# Patient Record
Sex: Male | Born: 1960 | Race: White | Hispanic: No | Marital: Married | Smoking: Never smoker
Health system: Southern US, Community
[De-identification: ages and names within clinical notes are randomized; demographics above are authoritative.]

## PROBLEM LIST (undated history)

## (undated) DIAGNOSIS — I2510 Atherosclerotic heart disease of native coronary artery without angina pectoris: Secondary | ICD-10-CM

## (undated) DIAGNOSIS — F32A Depression, unspecified: Secondary | ICD-10-CM

## (undated) DIAGNOSIS — R7302 Impaired glucose tolerance (oral): Secondary | ICD-10-CM

## (undated) DIAGNOSIS — I200 Unstable angina: Secondary | ICD-10-CM

## (undated) DIAGNOSIS — F329 Major depressive disorder, single episode, unspecified: Secondary | ICD-10-CM

## (undated) DIAGNOSIS — R9439 Abnormal result of other cardiovascular function study: Secondary | ICD-10-CM

## (undated) DIAGNOSIS — I10 Essential (primary) hypertension: Secondary | ICD-10-CM

## (undated) DIAGNOSIS — R42 Dizziness and giddiness: Secondary | ICD-10-CM

## (undated) DIAGNOSIS — R0789 Other chest pain: Secondary | ICD-10-CM

## (undated) HISTORY — DX: Dizziness and giddiness: R42

## (undated) HISTORY — DX: Essential (primary) hypertension: I10

## (undated) HISTORY — DX: Morbid (severe) obesity due to excess calories: E66.01

## (undated) HISTORY — DX: Major depressive disorder, single episode, unspecified: F32.9

## (undated) HISTORY — DX: Unstable angina: I20.0

## (undated) HISTORY — DX: Other chest pain: R07.89

## (undated) HISTORY — DX: Impaired glucose tolerance (oral): R73.02

## (undated) HISTORY — DX: Depression, unspecified: F32.A

## (undated) HISTORY — PX: HYPOSPADIAS CORRECTION: SHX483

---

## 1999-02-07 ENCOUNTER — Other Ambulatory Visit: Admission: RE | Admit: 1999-02-07 | Discharge: 1999-02-07 | Payer: Self-pay | Admitting: Urology

## 2015-12-27 ENCOUNTER — Ambulatory Visit (HOSPITAL_COMMUNITY): Payer: 59 | Attending: Cardiovascular Disease

## 2015-12-27 ENCOUNTER — Encounter: Payer: Self-pay | Admitting: Cardiovascular Disease

## 2015-12-27 ENCOUNTER — Ambulatory Visit (INDEPENDENT_AMBULATORY_CARE_PROVIDER_SITE_OTHER): Payer: 59 | Admitting: Cardiovascular Disease

## 2015-12-27 VITALS — BP 150/90 | HR 58 | Ht 71.75 in | Wt 309.2 lb

## 2015-12-27 DIAGNOSIS — R9439 Abnormal result of other cardiovascular function study: Secondary | ICD-10-CM | POA: Insufficient documentation

## 2015-12-27 DIAGNOSIS — R42 Dizziness and giddiness: Secondary | ICD-10-CM | POA: Insufficient documentation

## 2015-12-27 DIAGNOSIS — I200 Unstable angina: Secondary | ICD-10-CM | POA: Insufficient documentation

## 2015-12-27 DIAGNOSIS — I208 Other forms of angina pectoris: Secondary | ICD-10-CM | POA: Diagnosis not present

## 2015-12-27 DIAGNOSIS — R0789 Other chest pain: Secondary | ICD-10-CM | POA: Insufficient documentation

## 2015-12-27 DIAGNOSIS — I10 Essential (primary) hypertension: Secondary | ICD-10-CM | POA: Diagnosis not present

## 2015-12-27 DIAGNOSIS — F329 Major depressive disorder, single episode, unspecified: Secondary | ICD-10-CM | POA: Insufficient documentation

## 2015-12-27 DIAGNOSIS — I517 Cardiomegaly: Secondary | ICD-10-CM | POA: Insufficient documentation

## 2015-12-27 DIAGNOSIS — R079 Chest pain, unspecified: Secondary | ICD-10-CM | POA: Diagnosis not present

## 2015-12-27 DIAGNOSIS — R7302 Impaired glucose tolerance (oral): Secondary | ICD-10-CM | POA: Insufficient documentation

## 2015-12-27 DIAGNOSIS — F32A Depression, unspecified: Secondary | ICD-10-CM | POA: Insufficient documentation

## 2015-12-27 LAB — MYOCARDIAL PERFUSION IMAGING
CHL CUP NUCLEAR SSS: 7
CSEPED: 9 min
CSEPPHR: 148 {beats}/min
Estimated workload: 10.1 METS
Exercise duration (sec): 0 s
LV dias vol: 163 mL
LVSYSVOL: 80 mL
MPHR: 166 {beats}/min
Percent HR: 89 %
RATE: 0.4
Rest HR: 55 {beats}/min
SDS: 5
SRS: 2
TID: 0.99

## 2015-12-27 MED ORDER — NITROGLYCERIN 0.4 MG SL SUBL: 0.4000 mg | SUBLINGUAL_TABLET | SUBLINGUAL | Status: DC | PRN

## 2015-12-27 MED ORDER — TECHNETIUM TC 99M SESTAMIBI GENERIC - CARDIOLITE
32.9000 | Freq: Once | INTRAVENOUS | Status: AC | PRN
  Administered 2015-12-27: 32.9 via INTRAVENOUS

## 2015-12-27 MED ORDER — TECHNETIUM TC 99M SESTAMIBI GENERIC - CARDIOLITE
10.2000 | Freq: Once | INTRAVENOUS | Status: AC | PRN
  Administered 2015-12-27: 10 via INTRAVENOUS

## 2015-12-27 NOTE — Patient Instructions (Signed)
Medication Instructions:  Your physician recommends that you continue on your current medications as directed. Please refer to the Current Medication list given to you today.  Prescription given for Nitroglycerin to use as needed for Chest Pain. Place one tablet under the tongue every 5 minutes for Chest Pain.  Maximum dosage 3 tablets within 15 minutes.  If you are taking a third tablet then you need to call 911.   Labwork: No new orders.   Testing/Procedures: Your physician has requested that you have an exercise stress myoview. For further information please visit [email or web address]. Please follow instruction sheet, as given.  Follow-Up: Your physician recommends that you schedule a follow-up appointment in: [place] with Dr Burt Knack   Any Other Special Instructions Will Be Listed Below (If Applicable).     If you need a refill on your cardiac medications before your next appointment, please call your pharmacy.

## 2015-12-27 NOTE — Progress Notes (Signed)
Cardiology Office Note Date:  12/27/2015   ID:  PRIDE NOVO, DOB 08/12/1961, MRN [id number]  PCP:  Geoffery Lyons, MD  Cardiologist:  Sherren Mocha, MD    Chief Complaint  Patient presents with  . New Evaluation    +"Pinch" in chest   denies sob/le edema     History of Present Illness: Francis Foster is a 55 y.o. male who presents for evaluation of chest pain. He's been followed regularly by Dr Reynaldo Minium for > 20 years and other than obesity and controlled hypertension he hasn't had any major medical problems. He's never been hospitalized.   He had 2 episodes of chest tightness over the Holidays. He checked his blood pressure during those episodes and it was markedly elevated, > 248mmHg on one occasion. Last week he was at the beach and went for a walk, experienced chest pressure about 15 minutes into his walk. Describes a 'pressure' that was in the substernal region, nonradiating, and sx's subsided when he slowed his pace. This felt similar to the chest pain he had on other occasions recently. Denies shortness of breath, edema, orthopnea, or PND.  Describes an episode of lightheadedness and weakness, diaphoresis recently when he was stooped over washing the car. No other recent symptoms to report.   Reports weight gain over the last 6 months, approximately 15 pounds.   There is no family history of CAD. Both parents are alive at 9 and 40 without cardiovascular problems.   Past Medical History  Diagnosis Date  . Benign essential HTN   . Morbid obesity [facility])     Past Surgical History  Procedure Laterality Date  . Hypospadias correction      55 years old    Current Outpatient Prescriptions  Medication Sig Dispense Refill  . aspirin 81 MG tablet Take 81 mg by mouth daily.    Marland Kitchen lisinopril-hydrochlorothiazide (PRINZIDE,ZESTORETIC) 20-12.5 MG tablet Take 1 tablet by mouth daily.    . nebivolol (BYSTOLIC) 10 MG tablet Take 10 mg by mouth daily.    . nitroGLYCERIN  (NITROSTAT) 0.4 MG SL tablet Place 1 tablet (0.4 mg total) under the tongue every 5 (five) minutes as needed for chest pain. 25 tablet 2   No current facility-administered medications for this visit.    Allergies:   Review of patient's allergies indicates not on file.   Social History:  The patient  reports that he has never smoked. He does not have any smokeless tobacco history on file. He reports that he drinks alcohol. He reports that he does not use illicit drugs.   Family History:  The patient's family history is negative for CAD/MI/CABG.  ROS:  Please see the history of present illness.  Otherwise, review of systems is positive for dizziness.  All other systems are reviewed and negative.    PHYSICAL EXAM: VS:  BP 150/90 mmHg  Pulse 58  Ht 5' 11.75" (1.822 m)  Wt 309 lb 3.2 oz (140.252 kg)  BMI 42.25 kg/m2 , BMI Body mass index is 42.25 kg/(m^2). GEN: Well nourished, well developed, obese male in no acute distress HEENT: normal Neck: no JVD, no masses. No carotid bruits Cardiac: RRR without murmur or gallop                Respiratory:  clear to auscultation bilaterally, normal work of breathing GI: soft, nontender, nondistended, + BS MS: no deformity or atrophy Ext: no pretibial edema, pedal pulses 2+= bilaterally Skin: warm and dry, no rash Neuro:  Strength and sensation are intact Psych: euthymic mood, full affect  EKG:  EKG is ordered today. The ekg ordered today shows Sinus bradycardia 58 bpm, otherwise within normal limits  Recent Labs: No results found for requested labs within last 365 days.   Lipid Panel  No results found for: CHOL, TRIG, HDL, CHOLHDL, VLDL, LDLCALC, LDLDIRECT    Wt Readings from Last 3 Encounters:  12/27/15 309 lb 3.2 oz (140.252 kg)     ASSESSMENT AND PLAN: 1.  Exertional angina: new onset symptoms with moderate physical exertion. Also with 2 episdoes of resting chest discomfort over the Holidays associated with marked hypertension.  Differential dx includes obstructive CAD versus hypertensive disease as most likely etiologies. He has been started on a beta-blocker by Dr Reynaldo Minium, now taking ASA 81 mg daily as well. I recommended that he continue same Rx's with bystolic, zestoretic, and ASA. Will check an exercise stress Myoview for further risk stratification. We had a lengthy discussion about diagnostic approach of noninvasive stress testing versus cardiac cath as first test and he prefers noninvasive assessment. Further disposition/testing pending stress test results. He is written a Rx for sublingual NTG prn and provided instructions if recurrent chest pain occurs.  Will arrange FU in 6 weeks and he will call sooner if recurrent symptoms.  2. Essential HTN, uncontrolled: zestoretic long-term, bystolic added yesterday. Continue same Rx.  Current medicines are reviewed with the patient today.  The patient does not have concerns regarding medicines.  Labs/ tests ordered today include:   Orders Placed This Encounter  Procedures  . Myocardial Perfusion Imaging  . EKG 12-Lead    Disposition:   FU 6 weeks or sooner if problems  Signed, Sherren Mocha, MD  12/27/2015 9:09 AM    [facility] [place], [place], [place]  [address] Phone: [phone]; Fax: [phone]

## 2015-12-28 ENCOUNTER — Encounter (HOSPITAL_COMMUNITY)
Admission: RE | Disposition: A | Discharge status: Home / Self Care | Payer: Self-pay | Source: Ambulatory Visit | Attending: Cardiovascular Disease

## 2015-12-28 ENCOUNTER — Other Ambulatory Visit: Payer: Self-pay | Admitting: Cardiovascular Disease

## 2015-12-28 ENCOUNTER — Other Ambulatory Visit: Payer: Self-pay

## 2015-12-28 ENCOUNTER — Encounter (HOSPITAL_COMMUNITY): Payer: Self-pay | Admitting: Cardiovascular Disease

## 2015-12-28 ENCOUNTER — Ambulatory Visit (HOSPITAL_COMMUNITY)
Admission: RE | Admit: 2015-12-28 | Discharge: 2015-12-29 | Disposition: A | Discharge status: Home / Self Care | Payer: 59 | Source: Ambulatory Visit | Attending: Cardiovascular Disease | Admitting: Cardiovascular Disease

## 2015-12-28 DIAGNOSIS — Z6841 Body Mass Index (BMI) 40.0 and over, adult: Secondary | ICD-10-CM | POA: Insufficient documentation

## 2015-12-28 DIAGNOSIS — I208 Other forms of angina pectoris: Secondary | ICD-10-CM

## 2015-12-28 DIAGNOSIS — Z955 Presence of coronary angioplasty implant and graft: Secondary | ICD-10-CM

## 2015-12-28 DIAGNOSIS — I10 Essential (primary) hypertension: Secondary | ICD-10-CM | POA: Insufficient documentation

## 2015-12-28 DIAGNOSIS — I25118 Atherosclerotic heart disease of native coronary artery with other forms of angina pectoris: Secondary | ICD-10-CM | POA: Diagnosis present

## 2015-12-28 DIAGNOSIS — Z7982 Long term (current) use of aspirin: Secondary | ICD-10-CM | POA: Insufficient documentation

## 2015-12-28 DIAGNOSIS — I2089 Other forms of angina pectoris: Secondary | ICD-10-CM | POA: Diagnosis present

## 2015-12-28 DIAGNOSIS — R9439 Abnormal result of other cardiovascular function study: Secondary | ICD-10-CM | POA: Diagnosis present

## 2015-12-28 DIAGNOSIS — I2510 Atherosclerotic heart disease of native coronary artery without angina pectoris: Secondary | ICD-10-CM | POA: Diagnosis not present

## 2015-12-28 HISTORY — PX: CARDIAC CATHETERIZATION: SHX172

## 2015-12-28 HISTORY — DX: Abnormal result of other cardiovascular function study: R94.39

## 2015-12-28 HISTORY — DX: Atherosclerotic heart disease of native coronary artery without angina pectoris: I25.10

## 2015-12-28 LAB — BASIC METABOLIC PANEL
Anion gap: 9 (ref 5–15)
BUN: 22 mg/dL — AB (ref 6–20)
CHLORIDE: 105 mmol/L (ref 101–111)
CO2: 27 mmol/L (ref 22–32)
CREATININE: 1.14 mg/dL (ref 0.61–1.24)
Calcium: 9.8 mg/dL (ref 8.9–10.3)
GFR calc Af Amer: >60 mL/min (ref >60)
GFR calc non Af Amer: >60 mL/min (ref >60)
GLUCOSE: 103 mg/dL — AB (ref 65–99)
Potassium: 5 mmol/L (ref 3.5–5.1)
SODIUM: 141 mmol/L (ref 135–145)

## 2015-12-28 LAB — PROTIME-INR
INR: 1.07 (ref 0.00–1.49)
Prothrombin Time: 14.1 seconds (ref 11.6–15.2)

## 2015-12-28 LAB — CBC
HEMATOCRIT: 46 % (ref 39.0–52.0)
Hemoglobin: 15.1 g/dL (ref 13.0–17.0)
MCH: 30 pg (ref 26.0–34.0)
MCHC: 32.8 g/dL (ref 30.0–36.0)
MCV: 91.3 fL (ref 78.0–100.0)
PLATELETS: 178 10*3/uL (ref 150–400)
RBC: 5.04 MIL/uL (ref 4.22–5.81)
RDW: 13.6 % (ref 11.5–15.5)
WBC: 7.1 10*3/uL (ref 4.0–10.5)

## 2015-12-28 LAB — POCT ACTIVATED CLOTTING TIME: Activated Clotting Time: 291 seconds

## 2015-12-28 SURGERY — LEFT HEART CATH AND CORONARY ANGIOGRAPHY

## 2015-12-28 MED ORDER — SODIUM CHLORIDE 0.9 % IJ SOLN: 3.0000 mL | Freq: Two times a day (BID) | INTRAMUSCULAR | Status: DC

## 2015-12-28 MED ORDER — IOHEXOL 350 MG/ML SOLN
INTRAVENOUS | Status: DC | PRN
  Administered 2015-12-28: 135 mL via INTRA_ARTERIAL

## 2015-12-28 MED ORDER — NITROGLYCERIN 1 MG/10 ML FOR IR/CATH LAB
INTRA_ARTERIAL | Status: DC | PRN
  Administered 2015-12-28: 150 ug via INTRACORONARY

## 2015-12-28 MED ORDER — ONDANSETRON HCL 4 MG/2ML IJ SOLN: 4.0000 mg | Freq: Four times a day (QID) | INTRAMUSCULAR | Status: DC | PRN

## 2015-12-28 MED ORDER — ATORVASTATIN CALCIUM 80 MG PO TABS
80.0000 mg | ORAL_TABLET | Freq: Every day | ORAL | Status: DC
  Administered 2015-12-28: 21:00:00 80 mg via ORAL
  Filled 2015-12-28: qty 1

## 2015-12-28 MED ORDER — HYDROCHLOROTHIAZIDE 12.5 MG PO CAPS
12.5000 mg | ORAL_CAPSULE | Freq: Every day | ORAL | Status: DC
  Administered 2015-12-29: 09:00:00 12.5 mg via ORAL
  Filled 2015-12-28: qty 1

## 2015-12-28 MED ORDER — HEPARIN SODIUM (PORCINE) 1000 UNIT/ML IJ SOLN
INTRAMUSCULAR | Status: AC
  Filled 2015-12-28: qty 1

## 2015-12-28 MED ORDER — FENTANYL CITRATE (PF) 100 MCG/2ML IJ SOLN
INTRAMUSCULAR | Status: AC
  Filled 2015-12-28: qty 2

## 2015-12-28 MED ORDER — FENTANYL CITRATE (PF) 100 MCG/2ML IJ SOLN
INTRAMUSCULAR | Status: DC | PRN
  Administered 2015-12-28 (×2): 25 ug via INTRAVENOUS

## 2015-12-28 MED ORDER — NEBIVOLOL HCL 10 MG PO TABS
10.0000 mg | ORAL_TABLET | Freq: Every day | ORAL | Status: DC
  Administered 2015-12-29: 09:00:00 10 mg via ORAL
  Filled 2015-12-28: qty 1

## 2015-12-28 MED ORDER — HEPARIN (PORCINE) IN NACL 2-0.9 UNIT/ML-% IJ SOLN
INTRAMUSCULAR | Status: DC | PRN
  Administered 2015-12-28: 16:00:00

## 2015-12-28 MED ORDER — ASPIRIN 81 MG PO CHEW: 81.0000 mg | CHEWABLE_TABLET | ORAL | Status: DC

## 2015-12-28 MED ORDER — MIDAZOLAM HCL 2 MG/2ML IJ SOLN
INTRAMUSCULAR | Status: DC | PRN
Start: 2015-12-28 — End: 2015-12-28
  Administered 2015-12-28 (×2): 2 mg via INTRAVENOUS

## 2015-12-28 MED ORDER — SODIUM CHLORIDE 0.9 % IV SOLN
INTRAVENOUS | Status: DC
  Administered 2015-12-28: 13:00:00 via INTRAVENOUS

## 2015-12-28 MED ORDER — CLOPIDOGREL BISULFATE 75 MG PO TABS
75.0000 mg | ORAL_TABLET | Freq: Every day | ORAL | Status: DC
  Administered 2015-12-29: 07:00:00 75 mg via ORAL
  Filled 2015-12-28: qty 1

## 2015-12-28 MED ORDER — ASPIRIN 81 MG PO CHEW
81.0000 mg | CHEWABLE_TABLET | Freq: Every day | ORAL | Status: DC
Start: 2015-12-29 — End: 2015-12-29
  Administered 2015-12-29: 09:00:00 81 mg via ORAL
  Filled 2015-12-28: qty 1

## 2015-12-28 MED ORDER — CLOPIDOGREL BISULFATE 300 MG PO TABS
ORAL_TABLET | ORAL | Status: DC | PRN
  Administered 2015-12-28: 600 mg via ORAL

## 2015-12-28 MED ORDER — ANGIOPLASTY BOOK
Freq: Once | Status: AC
  Administered 2015-12-28
  Filled 2015-12-28: qty 1

## 2015-12-28 MED ORDER — LISINOPRIL 10 MG PO TABS
20.0000 mg | ORAL_TABLET | Freq: Every day | ORAL | Status: DC
  Administered 2015-12-29: 20 mg via ORAL
  Filled 2015-12-28: qty 2

## 2015-12-28 MED ORDER — SODIUM CHLORIDE 0.9 % IV SOLN: INTRAVENOUS | Status: DC

## 2015-12-28 MED ORDER — MIDAZOLAM HCL 2 MG/2ML IJ SOLN
INTRAMUSCULAR | Status: AC
  Filled 2015-12-28: qty 2

## 2015-12-28 MED ORDER — OXYCODONE-ACETAMINOPHEN 5-325 MG PO TABS: 1.0000 | ORAL_TABLET | ORAL | Status: DC | PRN

## 2015-12-28 MED ORDER — SODIUM CHLORIDE 0.9 % IV SOLN
250.0000 mL | INTRAVENOUS | Status: DC | PRN
Start: 2015-12-28 — End: 2015-12-28

## 2015-12-28 MED ORDER — CLOPIDOGREL BISULFATE 300 MG PO TABS
ORAL_TABLET | ORAL | Status: AC
  Filled 2015-12-28: qty 2

## 2015-12-28 MED ORDER — NITROGLYCERIN 1 MG/10 ML FOR IR/CATH LAB
INTRA_ARTERIAL | Status: AC
  Filled 2015-12-28: qty 10

## 2015-12-28 MED ORDER — HEPARIN SODIUM (PORCINE) 1000 UNIT/ML IJ SOLN
INTRAMUSCULAR | Status: DC | PRN
  Administered 2015-12-28 (×2): 6000 [IU] via INTRAVENOUS

## 2015-12-28 MED ORDER — VERAPAMIL HCL 2.5 MG/ML IV SOLN
INTRAVENOUS | Status: AC
  Filled 2015-12-28: qty 2

## 2015-12-28 MED ORDER — SODIUM CHLORIDE 0.9 % IJ SOLN: 3.0000 mL | INTRAMUSCULAR | Status: DC | PRN

## 2015-12-28 MED ORDER — HEPARIN (PORCINE) IN NACL 2-0.9 UNIT/ML-% IJ SOLN
INTRAMUSCULAR | Status: AC
  Filled 2015-12-28: qty 1000

## 2015-12-28 MED ORDER — VERAPAMIL HCL 2.5 MG/ML IV SOLN
INTRAVENOUS | Status: DC | PRN
  Administered 2015-12-28: 16:00:00 via INTRA_ARTERIAL

## 2015-12-28 MED ORDER — SODIUM CHLORIDE 0.9 % IV SOLN: 250.0000 mL | INTRAVENOUS | Status: DC | PRN

## 2015-12-28 MED ORDER — ACETAMINOPHEN 325 MG PO TABS: 650.0000 mg | ORAL_TABLET | ORAL | Status: DC | PRN

## 2015-12-28 MED ORDER — LISINOPRIL-HYDROCHLOROTHIAZIDE 20-12.5 MG PO TABS: 1.0000 | ORAL_TABLET | Freq: Every day | ORAL | Status: DC

## 2015-12-28 MED ORDER — LIDOCAINE HCL (PF) 1 % IJ SOLN
INTRAMUSCULAR | Status: AC
  Filled 2015-12-28: qty 30

## 2015-12-28 SURGICAL SUPPLY — 19 items
BALLN EMERGE MR 2.0X12 (BALLOONS) ×2
BALLN ~~LOC~~ EMERGE MR 2.5X8 (BALLOONS) ×2
BALLOON EMERGE MR 2.0X12 (BALLOONS) IMPLANT
BALLOON ~~LOC~~ EMERGE MR 2.5X8 (BALLOONS) IMPLANT
CATH INFINITI 5 FR JL3.5 (CATHETERS) ×2 IMPLANT
CATH INFINITI 5FR ANG PIGTAIL (CATHETERS) IMPLANT
CATH INFINITI JR4 5F (CATHETERS) ×2 IMPLANT
CATH VISTA GUIDE 6FR XBLAD3.5 (CATHETERS) ×1 IMPLANT
DEVICE RAD COMP TR BAND LRG (VASCULAR PRODUCTS) ×2 IMPLANT
GLIDESHEATH SLEND SS 6F .021 (SHEATH) ×2 IMPLANT
KIT ENCORE 26 ADVANTAGE (KITS) ×1 IMPLANT
KIT HEART LEFT (KITS) ×2 IMPLANT
PACK CARDIAC CATHETERIZATION (CUSTOM PROCEDURE TRAY) ×2 IMPLANT
STENT PROMUS PREM MR 2.5X12 (Permanent Stent) ×1 IMPLANT
SYR MEDRAD MARK V 150ML (SYRINGE) IMPLANT
TRANSDUCER W/STOPCOCK (MISCELLANEOUS) ×2 IMPLANT
TUBING CIL FLEX 10 FLL-RA (TUBING) ×2 IMPLANT
WIRE COUGAR XT STRL 190CM (WIRE) ×1 IMPLANT
WIRE SAFE-T 1.5MM-J .035X260CM (WIRE) ×2 IMPLANT

## 2015-12-28 NOTE — H&P (View-Only) (Signed)
Cardiology Office Note Date:  12/27/2015   ID:  Francis Foster, DOB October 27, 1961, MRN [id number]  PCP:  Geoffery Lyons, MD  Cardiologist:  Sherren Mocha, MD    Chief Complaint  Patient presents with  . New Evaluation    +"Pinch" in chest   denies sob/le edema     History of Present Illness: Francis Foster is a 55 y.o. male who presents for evaluation of chest pain. He's been followed regularly by Dr Reynaldo Minium for > 20 years and other than obesity and controlled hypertension he hasn't had any major medical problems. He's never been hospitalized.   He had 2 episodes of chest tightness over the Holidays. He checked his blood pressure during those episodes and it was markedly elevated, > 28mmHg on one occasion. Last week he was at the beach and went for a walk, experienced chest pressure about 15 minutes into his walk. Describes a 'pressure' that was in the substernal region, nonradiating, and sx's subsided when he slowed his pace. This felt similar to the chest pain he had on other occasions recently. Denies shortness of breath, edema, orthopnea, or PND.  Describes an episode of lightheadedness and weakness, diaphoresis recently when he was stooped over washing the car. No other recent symptoms to report.   Reports weight gain over the last 6 months, approximately 15 pounds.   There is no family history of CAD. Both parents are alive at 53 and 37 without cardiovascular problems.   Past Medical History  Diagnosis Date  . Benign essential HTN   . Morbid obesity [facility]     Past Surgical History  Procedure Laterality Date  . Hypospadias correction      55 years old    Current Outpatient Prescriptions  Medication Sig Dispense Refill  . aspirin 81 MG tablet Take 81 mg by mouth daily.    Marland Kitchen lisinopril-hydrochlorothiazide (PRINZIDE,ZESTORETIC) 20-12.5 MG tablet Take 1 tablet by mouth daily.    . nebivolol (BYSTOLIC) 10 MG tablet Take 10 mg by mouth daily.    . nitroGLYCERIN  (NITROSTAT) 0.4 MG SL tablet Place 1 tablet (0.4 mg total) under the tongue every 5 (five) minutes as needed for chest pain. 25 tablet 2   No current facility-administered medications for this visit.    Allergies:   Review of patient's allergies indicates not on file.   Social History:  The patient  reports that he has never smoked. He does not have any smokeless tobacco history on file. He reports that he drinks alcohol. He reports that he does not use illicit drugs.   Family History:  The patient's family history is negative for CAD/MI/CABG.  ROS:  Please see the history of present illness.  Otherwise, review of systems is positive for dizziness.  All other systems are reviewed and negative.    PHYSICAL EXAM: VS:  BP 150/90 mmHg  Pulse 58  Ht 5' 11.75" (1.822 m)  Wt 309 lb 3.2 oz (140.252 kg)  BMI 42.25 kg/m2 , BMI Body mass index is 42.25 kg/(m^2). GEN: Well nourished, well developed, obese male in no acute distress HEENT: normal Neck: no JVD, no masses. No carotid bruits Cardiac: RRR without murmur or gallop                Respiratory:  clear to auscultation bilaterally, normal work of breathing GI: soft, nontender, nondistended, + BS MS: no deformity or atrophy Ext: no pretibial edema, pedal pulses 2+= bilaterally Skin: warm and dry, no rash Neuro:  Strength and sensation are intact Psych: euthymic mood, full affect  EKG:  EKG is ordered today. The ekg ordered today shows Sinus bradycardia 58 bpm, otherwise within normal limits  Recent Labs: No results found for requested labs within last 365 days.   Lipid Panel  No results found for: CHOL, TRIG, HDL, CHOLHDL, VLDL, LDLCALC, LDLDIRECT    Wt Readings from Last 3 Encounters:  12/27/15 309 lb 3.2 oz (140.252 kg)     ASSESSMENT AND PLAN: 1.  Exertional angina: new onset symptoms with moderate physical exertion. Also with 2 episdoes of resting chest discomfort over the Holidays associated with marked hypertension.  Differential dx includes obstructive CAD versus hypertensive disease as most likely etiologies. He has been started on a beta-blocker by Dr Reynaldo Minium, now taking ASA 81 mg daily as well. I recommended that he continue same Rx's with bystolic, zestoretic, and ASA. Will check an exercise stress Myoview for further risk stratification. We had a lengthy discussion about diagnostic approach of noninvasive stress testing versus cardiac cath as first test and he prefers noninvasive assessment. Further disposition/testing pending stress test results. He is written a Rx for sublingual NTG prn and provided instructions if recurrent chest pain occurs.  Will arrange FU in 6 weeks and he will call sooner if recurrent symptoms.  2. Essential HTN, uncontrolled: zestoretic long-term, bystolic added yesterday. Continue same Rx.  Current medicines are reviewed with the patient today.  The patient does not have concerns regarding medicines.  Labs/ tests ordered today include:   Orders Placed This Encounter  Procedures  . Myocardial Perfusion Imaging  . EKG 12-Lead    Disposition:   FU 6 weeks or sooner if problems  Signed, Sherren Mocha, MD  12/27/2015 9:09 AM    [facility] [place], [place], [place]  [address] Phone: [phone]; Fax: [phone]

## 2015-12-28 NOTE — Research (Signed)
CADLAD Informed Consent   Subject Name: Francis Foster  Subject met inclusion and exclusion criteria.  The informed consent form, study requirements and expectations were reviewed with the subject and questions and concerns were addressed prior to the signing of the consent form.  The subject verbalized understanding of the trail requirements.  The subject agreed to participate in the CADLAD trial and signed the informed consent.  The informed consent was obtained prior to performance of any protocol-specific procedures for the subject.  A copy of the signed informed consent was given to the subject and a copy was placed in the subject's medical record.  Jake Bathe Jr. 12/28/2015, 1250pm

## 2015-12-28 NOTE — Interval H&P Note (Signed)
Cath Lab Visit (complete for each Cath Lab visit)  Clinical Evaluation Leading to the Procedure:   ACS: No.  Non-ACS:    Anginal Classification: CCS II  Anti-ischemic medical therapy: Minimal Therapy (1 class of medications)  Non-Invasive Test Results: Intermediate-risk stress test findings: cardiac mortality 1-3%/year  Prior CABG: No previous CABG      History and Physical Interval Note:  12/28/2015 3:19 PM  Francis Foster  has presented today for surgery, with the diagnosis of cp/abnormal mioview  The various methods of treatment have been discussed with the patient and family. After consideration of risks, benefits and other options for treatment, the patient has consented to  Procedure(s): Left Heart Cath and Coronary Angiography (N/A) as a surgical intervention .  The patient's history has been reviewed, patient examined, no change in status, stable for surgery.  I have reviewed the patient's chart and labs.  Questions were answered to the patient's satisfaction.     Sherren Mocha

## 2015-12-29 ENCOUNTER — Encounter (HOSPITAL_COMMUNITY): Payer: Self-pay | Admitting: Cardiology

## 2015-12-29 ENCOUNTER — Other Ambulatory Visit: Payer: Self-pay

## 2015-12-29 DIAGNOSIS — I208 Other forms of angina pectoris: Secondary | ICD-10-CM

## 2015-12-29 DIAGNOSIS — I25118 Atherosclerotic heart disease of native coronary artery with other forms of angina pectoris: Secondary | ICD-10-CM | POA: Diagnosis not present

## 2015-12-29 DIAGNOSIS — I10 Essential (primary) hypertension: Secondary | ICD-10-CM | POA: Diagnosis not present

## 2015-12-29 DIAGNOSIS — I25110 Atherosclerotic heart disease of native coronary artery with unstable angina pectoris: Secondary | ICD-10-CM | POA: Diagnosis not present

## 2015-12-29 DIAGNOSIS — Z6841 Body Mass Index (BMI) 40.0 and over, adult: Secondary | ICD-10-CM | POA: Diagnosis not present

## 2015-12-29 DIAGNOSIS — R931 Abnormal findings on diagnostic imaging of heart and coronary circulation: Secondary | ICD-10-CM | POA: Diagnosis not present

## 2015-12-29 DIAGNOSIS — I2510 Atherosclerotic heart disease of native coronary artery without angina pectoris: Secondary | ICD-10-CM | POA: Diagnosis not present

## 2015-12-29 HISTORY — DX: Atherosclerotic heart disease of native coronary artery without angina pectoris: I25.10

## 2015-12-29 LAB — BASIC METABOLIC PANEL
Anion gap: 7 (ref 5–15)
BUN: 23 mg/dL — AB (ref 6–20)
CALCIUM: 9 mg/dL (ref 8.9–10.3)
CO2: 26 mmol/L (ref 22–32)
Chloride: 106 mmol/L (ref 101–111)
Creatinine, Ser: 1.09 mg/dL (ref 0.61–1.24)
GFR calc Af Amer: >60 mL/min (ref >60)
GLUCOSE: 99 mg/dL (ref 65–99)
Potassium: 4.1 mmol/L (ref 3.5–5.1)
Sodium: 139 mmol/L (ref 135–145)

## 2015-12-29 LAB — CBC
HCT: 41.3 % (ref 39.0–52.0)
Hemoglobin: 13.8 g/dL (ref 13.0–17.0)
MCH: 30.5 pg (ref 26.0–34.0)
MCHC: 33.4 g/dL (ref 30.0–36.0)
MCV: 91.4 fL (ref 78.0–100.0)
Platelets: 167 10*3/uL (ref 150–400)
RBC: 4.52 MIL/uL (ref 4.22–5.81)
RDW: 13.8 % (ref 11.5–15.5)
WBC: 8.3 10*3/uL (ref 4.0–10.5)

## 2015-12-29 MED ORDER — ATORVASTATIN CALCIUM 80 MG PO TABS: 80.0000 mg | ORAL_TABLET | Freq: Every day | ORAL | Status: DC

## 2015-12-29 MED ORDER — CLOPIDOGREL BISULFATE 75 MG PO TABS: 75.0000 mg | ORAL_TABLET | Freq: Every day | ORAL | Status: DC

## 2015-12-29 MED ORDER — LISINOPRIL-HYDROCHLOROTHIAZIDE 20-12.5 MG PO TABS: 1.0000 | ORAL_TABLET | Freq: Every day | ORAL | Status: DC

## 2015-12-29 MED ORDER — NEBIVOLOL HCL 10 MG PO TABS: 10.0000 mg | ORAL_TABLET | Freq: Every day | ORAL | Status: DC

## 2015-12-29 NOTE — Discharge Instructions (Signed)
No driving for 24 hours. No lifting over 5 lbs for 1 week. No sexual activity for 1 week. Keep procedure site clean & dry. If you notice increased pain, swelling, bleeding or pus, call/return!  You may shower, but no soaking baths/hot tubs/pools for 1 week.  ° ° °

## 2015-12-29 NOTE — Progress Notes (Signed)
CARDIAC REHAB PHASE I   PRE:  Rate/Rhythm: Sinus 60  BP:    Sitting: 150/73     SaO2: 96% Room Air   MODE:  Ambulation: 600 ft   POST:  Rate/Rhythem: Sinus 62  BP:    Sitting: 156/70     SaO2: 96% room air  [phone] Patient ambulated independently in the hallway without symptoms. Education completed. Discussed heart healthy diet, risk factors, stent and stent card when to call 911. Patient is interested in phase 2 cardiac rehab.  Chaylee Ehrsam, Christa See RN BSN

## 2015-12-29 NOTE — Progress Notes (Signed)
   Patient Name: Francis Foster Date of Encounter: 12/29/2015  Primary Cardiologist: Dr. Burt Knack   Principal Problem:   Abnormal nuclear stress test Active Problems:   Benign essential HTN   Morbid obesity ([place])   Exertional angina ([place])    SUBJECTIVE  Denies any CP or SOB.   CURRENT MEDS . aspirin  81 mg Oral Daily  . atorvastatin  80 mg Oral q1800  . clopidogrel  75 mg Oral Q breakfast  . hydrochlorothiazide  12.5 mg Oral Daily  . lisinopril  20 mg Oral Daily  . nebivolol  10 mg Oral Daily  . sodium chloride  3 mL Intravenous Q12H    OBJECTIVE  Filed Vitals:   12/28/15 1700 12/28/15 1800 12/28/15 2050 12/29/15 0300  BP: 131/79 120/70 129/65 116/68  Pulse: 54 56 65 58  Temp: 98.2 F (36.8 C)  97.9 F (36.6 C) 97.8 F (36.6 C)  TempSrc: Oral  Oral Oral  Resp: 19 18 19 20   Height:      Weight:    299 lb 13.2 oz (136 kg)  SpO2: 96% 97% 98% 96%    Intake/Output Summary (Last 24 hours) at 12/29/15 0732 Last data filed at 12/28/15 2030  Gross per 24 hour  Intake 1011.67 ml  Output    425 ml  Net 586.67 ml   Filed Weights   12/28/15 1142 12/29/15 0300  Weight: 308 lb (139.708 kg) 299 lb 13.2 oz (136 kg)    PHYSICAL EXAM  General: Pleasant, NAD. Neuro: Alert and oriented X 3. Moves all extremities spontaneously. Psych: Normal affect. HEENT:  Normal  Neck: Supple without bruits or JVD. Lungs:  Resp regular and unlabored, CTA. Heart: RRR no s3, s4, or murmurs. R radial cath site stable, no hematoma or bleeding.  Abdomen: Soft, non-tender, non-distended, BS + x 4.  Extremities: No clubbing, cyanosis or edema. DP/PT/Radials 2+ and equal bilaterally.  Accessory Clinical Findings  CBC  Recent Labs  12/28/15 1230 12/29/15 0301  WBC 7.1 8.3  HGB 15.1 13.8  HCT 46.0 41.3  MCV 91.3 91.4  PLT 178 [id number]   Basic Metabolic Panel  Recent Labs  12/28/15 1230 12/29/15 0301  NA 141 139  K 5.0 4.1  CL 105 106  CO2 27 26  GLUCOSE 103* 99  BUN 22* 23*    CREATININE 1.14 1.09  CALCIUM 9.8 9.0    TELE Sinus brady with HR high 40s to 50s, no significant ventricular ectopy    ECG  NSR without significant ST-T wave changes  Radiology/Studies  No results found.  ASSESSMENT AND PLAN  55 yo male with PMH of HTN presented with exertional angina. Treadmill nuc 1/5 abnormal with EF 45-54%, medium size, severe intensity, partially reversible distal anterolateral/apical defect. Came in for cath  1. Exertional chest pain with abnormal treadmill nuc  - cath 12/28/2015 single vessel CAD with 95% ost D1 stenosis treated with DES  - continue ASA, plavix, lipitor, HCTZ, lisinopril and nebivolol  - likely can be discharged today depend on the weather condition.   2. HTN  Signed, Almyra Deforest PA-C Pager: [phone]

## 2015-12-29 NOTE — Discharge Summary (Signed)
Discharge Summary   Patient ID: Francis Foster,  MRN: [id number], DOB/AGE: May 16, 1961 55 y.o.  Admit date: 12/28/2015 Discharge date: 12/29/2015  Primary Care Provider: ARONSON,RICHARD A Primary Cardiologist: Dr. Burt Knack  Discharge Diagnoses Principal Problem:   Abnormal nuclear stress test Active Problems:   Benign essential HTN   Morbid obesity ([place])   Exertional angina ([facility])   CAD (coronary artery disease), native coronary artery   Allergies No Known Allergies  Procedures  Cardiac catheterization 12/28/2015 Conclusion    1. Severe diagonal stenosis with successful PCI using a DES platform. 2. No significant stenosis in the RCA, LCx, or LAD proper  Recommend: ASA, Plavix x 12 months, aggressive risk reduction.      Hospital Course  The patient is a 55 year old male with past medical history of hypertension with recent exertional angina symptom. He underwent treadmill nuclear stress test on 12/27/2015 which came back abnormal with EF 45-54%, medium sized, severe intensity, partially reversible distal anterolateral/apical defect. Given his abnormal nuclear stress test, he underwent cardiac catheterization on the following day which showed single-vessel CAD with 95% ostial D1 stenosis treated with DES.  Post-cath, he did well overnight. He was seen in the morning of 12/29/2015 at which time he denies any significant chest discomfort or shortness of breath. His radial cath site appears to be stable with good distal pulses. He has been placed on aspirin and Plavix along with Lipitor and home HCTZ/lisinopril and nebivolol. He ambulated without significant chest discomfort. He is deemed stable for discharge from cardiology perspective. He has been seen by cardiac rehabilitation and referred for outpatient rehabilitation. His pharmacy did not open today due to the weather, therefore I have given him a paper Rx for one month supply of his cardiac medication. I have also sent one year supply  of his cardiac medication to his pharmacy.   Discharge Vitals Blood pressure 156/71, pulse 65, temperature 97.8 F (36.6 C), temperature source Oral, resp. rate 18, height 5' 11.75" (1.822 m), weight 299 lb 13.2 oz (136 kg), SpO2 98 %.  Filed Weights   12/28/15 1142 12/29/15 0300  Weight: 308 lb (139.708 kg) 299 lb 13.2 oz (136 kg)    Labs  CBC  Recent Labs  12/28/15 1230 12/29/15 0301  WBC 7.1 8.3  HGB 15.1 13.8  HCT 46.0 41.3  MCV 91.3 91.4  PLT 178 [id number]   Basic Metabolic Panel  Recent Labs  12/28/15 1230 12/29/15 0301  NA 141 139  K 5.0 4.1  CL 105 106  CO2 27 26  GLUCOSE 103* 99  BUN 22* 23*  CREATININE 1.14 1.09  CALCIUM 9.8 9.0    Disposition  Pt is being discharged home today in good condition.  Follow-up Plans & Appointments      Follow-up Information    Follow up with Sherren Mocha, MD On 01/11/2016.   Specialty:  Cardiology   Why:  10:30am. Post hospital followup.   Contact information:   [id number] [address] [phone]       Discharge Medications    Medication List    TAKE these medications        aspirin 81 MG tablet  Take 81 mg by mouth daily.     atorvastatin 80 MG tablet  Commonly known as:  LIPITOR  Take 1 tablet (80 mg total) by mouth daily at 6 PM.     clopidogrel 75 MG tablet  Commonly known as:  PLAVIX  Take 1 tablet (75  mg total) by mouth daily with breakfast.     lisinopril-hydrochlorothiazide 20-12.5 MG tablet  Commonly known as:  PRINZIDE,ZESTORETIC  Take 1 tablet by mouth daily.     nebivolol 10 MG tablet  Commonly known as:  BYSTOLIC  Take 1 tablet (10 mg total) by mouth daily.     nitroGLYCERIN 0.4 MG SL tablet  Commonly known as:  NITROSTAT  Place 1 tablet (0.4 mg total) under the tongue every 5 (five) minutes as needed for chest pain.         Duration of Discharge Encounter   Greater than 30 minutes including physician time.  Hilbert Corrigan PA-C Pager:  [phone] 12/29/2015, 12:37 PM

## 2016-01-11 ENCOUNTER — Ambulatory Visit (INDEPENDENT_AMBULATORY_CARE_PROVIDER_SITE_OTHER): Payer: 59 | Admitting: Cardiovascular Disease

## 2016-01-11 ENCOUNTER — Encounter: Payer: Self-pay | Admitting: Cardiovascular Disease

## 2016-01-11 VITALS — BP 128/82 | HR 55 | Ht 71.0 in | Wt 310.4 lb

## 2016-01-11 DIAGNOSIS — E785 Hyperlipidemia, unspecified: Secondary | ICD-10-CM | POA: Diagnosis not present

## 2016-01-11 DIAGNOSIS — I2510 Atherosclerotic heart disease of native coronary artery without angina pectoris: Secondary | ICD-10-CM

## 2016-01-11 MED ORDER — NEBIVOLOL HCL 10 MG PO TABS: 10.0000 mg | ORAL_TABLET | Freq: Every day | ORAL | Status: DC

## 2016-01-11 NOTE — Progress Notes (Signed)
Cardiology Office Note Date:  01/11/2016   ID:  BURWELL BETHEL, DOB September 06, 1961, MRN [id number]  PCP:  Geoffery Lyons, MD  Cardiologist:  Sherren Mocha, MD    Chief Complaint  Patient presents with  . Coronary Artery Disease    History of Present Illness: Francis Foster is a 55 y.o. male who presents for follow-up evaluation. He was seen January 5 in initial consultation for new onset exertional angina. An exercise stress Myoview scan demonstrated a hypertensive response to exercise, and a reversible ischemic defect in the anterolateral and apical walls. Cardiac catheterization was performed and this demonstrated severe stenosis of the first diagonal branch. The patient was treated with PCI using a single drug-eluting stent. There was no other obstructive coronary disease identified. The patient returns today for follow-up evaluation.  He is doing well. He's take several changes to his diet. He is having no cardiac symptoms. He plans on starting cardiac rehabilitation in the near future. Today, he denies symptoms of palpitations, chest pain, shortness of breath, orthopnea, PND, lower extremity edema, dizziness, or syncope.  Past Medical History  Diagnosis Date  . Benign essential HTN   . Morbid obesity ([place])   . Chest discomfort   . Chest tightness   . Dizziness   . Lightheaded   . Depression   . IGT (impaired glucose tolerance)   . Unstable angina ([place])   . Abnormal nuclear stress test 12/28/2015  . CAD (coronary artery disease), native coronary artery 12/29/2015    S/p DES to ostial D1    Past Surgical History  Procedure Laterality Date  . Hypospadias correction      59 years old  . Cardiac catheterization N/A 12/28/2015    Procedure: Left Heart Cath and Coronary Angiography;  Surgeon: Sherren Mocha, MD;  Location: [facility];  Service: Cardiovascular;  Laterality: N/A;    Current Outpatient Prescriptions  Medication Sig Dispense Refill  . aspirin 81 MG tablet  Take 81 mg by mouth daily.    Marland Kitchen atorvastatin (LIPITOR) 80 MG tablet Take 1 tablet (80 mg total) by mouth daily at 6 PM. 90 tablet 3  . clopidogrel (PLAVIX) 75 MG tablet Take 1 tablet (75 mg total) by mouth daily with breakfast. 90 tablet 3  . lisinopril-hydrochlorothiazide (PRINZIDE,ZESTORETIC) 20-12.5 MG tablet Take 1 tablet by mouth daily. 90 tablet 3  . nebivolol (BYSTOLIC) 10 MG tablet Take 1 tablet (10 mg total) by mouth daily. 90 tablet 3  . nitroGLYCERIN (NITROSTAT) 0.4 MG SL tablet Place 1 tablet (0.4 mg total) under the tongue every 5 (five) minutes as needed for chest pain. 25 tablet 2   No current facility-administered medications for this visit.    Allergies:   Review of patient's allergies indicates no known allergies.   Social History:  The patient  reports that he has never smoked. He has never used smokeless tobacco. He reports that he drinks alcohol. He reports that he does not use illicit drugs.   Family History:  The patient's  family history includes Healthy in his sister; Neuropathy in his father; Osteoarthritis in his mother.    ROS:  Please see the history of present illness.  All other systems are reviewed and negative.    PHYSICAL EXAM: VS:  BP 128/82 mmHg  Pulse 55  Ht '5\' 11"'  (1.803 m)  Wt 310 lb 6.4 oz (140.797 kg)  BMI 43.31 kg/m2 , BMI Body mass index is 43.31 kg/(m^2). GEN: Well nourished, well developed, overweight male  in no acute distress Remainder of exam not performed as our time was spent in counseling today  EKG:  EKG is ordered today. The ekg ordered today shows sinus bradycardia 55 bpm, otherwise within normal limits.  Recent Labs: 12/29/2015: BUN 23*; Creatinine, Ser 1.09; Hemoglobin 13.8; Platelets 167; Potassium 4.1; Sodium 139   Lipid Panel  No results found for: CHOL, TRIG, HDL, CHOLHDL, VLDL, LDLCALC, LDLDIRECT    Wt Readings from Last 3 Encounters:  01/11/16 310 lb 6.4 oz (140.797 kg)  12/29/15 299 lb 13.2 oz (136 kg)  12/27/15 309  lb (140.161 kg)     Cardiac Studies Reviewed: Myoview: Study Highlights     Nuclear stress EF: 51%.  Blood pressure demonstrated a hypertensive response to exercise.  There was no ST segment deviation noted during stress.  This is an intermediate risk study.  Findings consistent with ischemia.  The left ventricular ejection fraction is mildly decreased (45-54%).  Intermediate risk stress nuclear study with a medium size, severe intensity, partially reversible distal anterolateral/apical defect consistent with apical thinning and moderate anterolateral/apical ischemia; EF 51 with apical hypokinesis; mild LVE.   Cath: Coronary Findings    Dominance: Right   Left Anterior Descending  Mild diffuse irregularity in the LAD is present. There is no significant stenosis throughout the LAD, but there is a critical lesion in the first diagonal branch.   . First Diagonal Branch   . Ost 1st Diag to 1st Diag lesion, 95% stenosed. Discrete.   Marland Kitchen PCI: The pre-interventional distal flow is normal (TIMI 3). Pre-stent angioplasty was performed. An unspecified stent was placed. Post-stent angioplasty was performed. Maximum pressure: 14 atm. The post-interventional distal flow is normal (TIMI 3). The intervention was successful. No complications occurred at this lesion. There is severe stenosis in the first diagonal branch. Heparin is used for anticoagulation. The patient was loaded with Plavix 600 mg. A cougar wire was advanced across the lesion without difficulty through an XB 3.5 cm guide catheter. The lesion was predilated with a 2.0 mm balloon, stented with a 2.5 x 12 mm Promus DES, and postdilated with a 2.5 mm noncompliant balloon to 14 atm.  . There is no residual stenosis post intervention.       Left Circumflex  The left circumflex is widely patent with no stenosis     Right Coronary Artery  The right coronary artery is a large, dominant vessel with no stenosis.     ASSESSMENT AND  PLAN: 1.  CAD, native vessel, without symptoms of angina: The patient is doing well after his recent PCI procedure. He is tolerating dual antiplatelet therapy without side effects. He's on appropriate medical therapy with an ARB, beta blocker, and statin drug. I will see him back when he's at one year out from PCI and we will plan on stopping Plavix at that point.  Cardiac catheterization films are reviewed with the patient today. We had extensive discussion about the impact of lifestyle on coronary atherosclerosis. We discussed specific dietary modification with reduction in starch and carbohydrate intake.  2. Essential hypertension: Blood pressure now well controlled on multidrug therapy.  3. Hyperlipidemia: The patient is on a high intensity statin drug. Lipids are followed by Dr. Reynaldo Minium.  Current medicines are reviewed with the patient today.  The patient does not have concerns regarding medicines.  Labs/ tests ordered today include:   Orders Placed This Encounter  Procedures  . EKG 12-Lead    Disposition:   FU one year  Signed,  Sherren Mocha, MD  01/11/2016 11:25 AM    [facility] [address], [place], [place]  [address] Phone: [phone]; Fax: [phone]

## 2016-01-11 NOTE — Patient Instructions (Addendum)
Medication Instructions:  Your physician recommends that you continue on your current medications as directed. Please refer to the Current Medication list given to you today.  Labwork: No new orders.   Testing/Procedures: No new orders.   Follow-Up: Your physician wants you to follow-up in: January 2018 with Dr Burt Knack.  You will receive a reminder letter in the mail two months in advance. If you don't receive a letter, please call our office to schedule the follow-up appointment.   Any Other Special Instructions Will Be Listed Below (If Applicable).     If you need a refill on your cardiac medications before your next appointment, please call your pharmacy.

## 2016-01-24 ENCOUNTER — Encounter (HOSPITAL_COMMUNITY)
Admission: RE | Admit: 2016-01-24 | Discharge: 2016-01-24 | Disposition: A | Discharge status: Home / Self Care | Payer: 59 | Source: Ambulatory Visit | Attending: Cardiovascular Disease | Admitting: Cardiovascular Disease

## 2016-01-24 DIAGNOSIS — Z955 Presence of coronary angioplasty implant and graft: Secondary | ICD-10-CM | POA: Insufficient documentation

## 2016-01-24 DIAGNOSIS — I213 ST elevation (STEMI) myocardial infarction of unspecified site: Secondary | ICD-10-CM | POA: Insufficient documentation

## 2016-01-24 NOTE — Progress Notes (Signed)
Cardiac Rehab Medication Review by a Pharmacist  Does the patient  feel that his/her medications are working for him/her?  yes  Has the patient been experiencing any side effects to the medications prescribed?  no  Does the patient measure his/her own blood pressure or blood glucose at home?  yes   Does the patient have any problems obtaining medications due to transportation or finances?   no  Understanding of regimen: excellent Understanding of indications: excellent Potential of compliance: good  Pharmacist comments: Pt has a good understanding of what each medication is for and how to take them. He had no additional questions for me.  Darl Pikes, PharmD Clinical Pharmacist- Resident Pager: [phone]  Darl Pikes 01/24/2016 8:26 AM

## 2016-01-28 ENCOUNTER — Encounter (HOSPITAL_COMMUNITY)
Admission: RE | Admit: 2016-01-28 | Discharge: 2016-01-28 | Disposition: A | Discharge status: Home / Self Care | Payer: 59 | Source: Ambulatory Visit | Attending: Cardiovascular Disease | Admitting: Cardiovascular Disease

## 2016-01-28 DIAGNOSIS — Z955 Presence of coronary angioplasty implant and graft: Secondary | ICD-10-CM | POA: Diagnosis not present

## 2016-01-28 DIAGNOSIS — I213 ST elevation (STEMI) myocardial infarction of unspecified site: Secondary | ICD-10-CM | POA: Diagnosis not present

## 2016-01-28 NOTE — Progress Notes (Signed)
Pt started cardiac rehab today.  Pt tolerated light exercise without difficulty. VSS, telemetry-Normal sinus brady, asymptomatic.  Medication list reconciled.  Pt verbalized compliance with medications and denies barriers to compliance. PSYCHOSOCIAL ASSESSMENT:  PHQ-0. Pt exhibits positive coping skills, hopeful outlook with supportive family. No psychosocial needs identified at this time, no psychosocial interventions necessary.    Pt enjoys walking, boating duck hunting and travel.   Pt cardiac rehab  goal is  to take better care of self and health.  Pt encouraged to participate in exercising on your own to increase ability to achieve these goals.   Pt long term cardiac rehab goal is put me first to exercise regularly and loose weight.  Pt oriented to exercise equipment and routine.  Understanding verbalized.Will continue to monitor the patient throughout  the program.

## 2016-01-30 ENCOUNTER — Encounter (HOSPITAL_COMMUNITY)
Admission: RE | Admit: 2016-01-30 | Discharge: 2016-01-30 | Disposition: A | Discharge status: Home / Self Care | Payer: 59 | Source: Ambulatory Visit | Attending: Cardiovascular Disease | Admitting: Cardiovascular Disease

## 2016-01-30 DIAGNOSIS — I213 ST elevation (STEMI) myocardial infarction of unspecified site: Secondary | ICD-10-CM | POA: Diagnosis not present

## 2016-02-01 ENCOUNTER — Encounter (HOSPITAL_COMMUNITY)
Admission: RE | Admit: 2016-02-01 | Discharge: 2016-02-01 | Disposition: A | Discharge status: Home / Self Care | Payer: 59 | Source: Ambulatory Visit | Attending: Cardiovascular Disease | Admitting: Cardiovascular Disease

## 2016-02-01 DIAGNOSIS — I213 ST elevation (STEMI) myocardial infarction of unspecified site: Secondary | ICD-10-CM | POA: Diagnosis not present

## 2016-02-04 ENCOUNTER — Encounter (HOSPITAL_COMMUNITY)
Admission: RE | Admit: 2016-02-04 | Discharge: 2016-02-04 | Disposition: A | Discharge status: Home / Self Care | Payer: 59 | Source: Ambulatory Visit | Attending: Cardiovascular Disease | Admitting: Cardiovascular Disease

## 2016-02-04 DIAGNOSIS — I213 ST elevation (STEMI) myocardial infarction of unspecified site: Secondary | ICD-10-CM | POA: Diagnosis not present

## 2016-02-06 ENCOUNTER — Encounter (HOSPITAL_COMMUNITY)
Admission: RE | Admit: 2016-02-06 | Discharge: 2016-02-06 | Disposition: A | Discharge status: Home / Self Care | Payer: 59 | Source: Ambulatory Visit | Attending: Cardiovascular Disease | Admitting: Cardiovascular Disease

## 2016-02-06 DIAGNOSIS — I213 ST elevation (STEMI) myocardial infarction of unspecified site: Secondary | ICD-10-CM | POA: Diagnosis not present

## 2016-02-08 ENCOUNTER — Encounter (HOSPITAL_COMMUNITY)
Admission: RE | Admit: 2016-02-08 | Discharge: 2016-02-08 | Disposition: A | Discharge status: Home / Self Care | Payer: 59 | Source: Ambulatory Visit | Attending: Cardiovascular Disease | Admitting: Cardiovascular Disease

## 2016-02-08 DIAGNOSIS — I213 ST elevation (STEMI) myocardial infarction of unspecified site: Secondary | ICD-10-CM | POA: Diagnosis not present

## 2016-02-08 NOTE — Progress Notes (Signed)
Reviewed home exercise with pt today.  Pt plans to walk at home for exercise.  On bad weather days, [place]  Reviewed THR, pulse, RPE, sign and symptoms, NTG use, and when to call 911 or MD.  Pt voiced understanding. Alberteen Sam, MA, ACSM RCEP

## 2016-02-11 ENCOUNTER — Encounter (HOSPITAL_COMMUNITY): Payer: 59

## 2016-02-13 ENCOUNTER — Encounter (HOSPITAL_COMMUNITY)
Admission: RE | Admit: 2016-02-13 | Discharge: 2016-02-13 | Disposition: A | Discharge status: Home / Self Care | Payer: 59 | Source: Ambulatory Visit | Attending: Cardiovascular Disease | Admitting: Cardiovascular Disease

## 2016-02-13 DIAGNOSIS — I213 ST elevation (STEMI) myocardial infarction of unspecified site: Secondary | ICD-10-CM | POA: Diagnosis not present

## 2016-02-13 NOTE — Progress Notes (Signed)
Francis Foster 55 y.o. male Nutrition Note Spoke with pt.  Nutrition Survey reviewed with pt. Pt is following Step 1 of the Therapeutic Lifestyle Changes diet. Pt and his girlfriend are on a "low-carb" diet. Pt eats out frequently due to business. Ways to make healthier choices when eating out discussed. Pt expressed understanding of the information reviewed. Pt aware of nutrition education classes offered and plans on attending nutrition classes. No results found for: HGBA1C Wt Readings from Last 3 Encounters:  01/24/16 305 lb 1.9 oz (138.4 kg)  01/11/16 310 lb 6.4 oz (140.797 kg)  12/29/15 299 lb 13.2 oz (136 kg)   Nutrition Diagnosis ? Food-and nutrition-related knowledge deficit related to lack of exposure to information as related to diagnosis of: ? CVD ? Obesity related to excessive energy intake as evidenced by a BMI of 41.4 Nutrition Intervention ? Benefits of adopting Therapeutic Lifestyle Changes discussed when Medficts reviewed. ? Pt to attend the Portion Distortion class ? Pt to attend the  ? Nutrition I class                        ? Nutrition II class ? Continue client-centered nutrition education by RD, as part of interdisciplinary care.  Goal(s) ? Pt to identify and limit food sources of saturated fat, trans fat, and sodium ? Pt to identify food quantities necessary to achieve weight loss of 6-24 lb (2.7-10.9 kg) at graduation from cardiac rehab.   Monitor and Evaluate progress toward nutrition goal with team.  Derek Mound, M.Ed, RD, LDN, CDE 02/13/2016 4:19 PM

## 2016-02-15 ENCOUNTER — Encounter (HOSPITAL_COMMUNITY)
Admission: RE | Admit: 2016-02-15 | Discharge: 2016-02-15 | Disposition: A | Discharge status: Home / Self Care | Payer: 59 | Source: Ambulatory Visit | Attending: Cardiovascular Disease | Admitting: Cardiovascular Disease

## 2016-02-15 DIAGNOSIS — I213 ST elevation (STEMI) myocardial infarction of unspecified site: Secondary | ICD-10-CM | POA: Diagnosis not present

## 2016-02-18 ENCOUNTER — Encounter (HOSPITAL_COMMUNITY)
Admission: RE | Admit: 2016-02-18 | Discharge: 2016-02-18 | Disposition: A | Discharge status: Home / Self Care | Payer: 59 | Source: Ambulatory Visit | Attending: Cardiovascular Disease | Admitting: Cardiovascular Disease

## 2016-02-20 ENCOUNTER — Encounter (HOSPITAL_COMMUNITY)
Admission: RE | Admit: 2016-02-20 | Discharge: 2016-02-20 | Disposition: A | Discharge status: Home / Self Care | Payer: 59 | Source: Ambulatory Visit | Attending: Cardiovascular Disease | Admitting: Cardiovascular Disease

## 2016-02-20 DIAGNOSIS — I213 ST elevation (STEMI) myocardial infarction of unspecified site: Secondary | ICD-10-CM | POA: Diagnosis not present

## 2016-02-20 DIAGNOSIS — Z955 Presence of coronary angioplasty implant and graft: Secondary | ICD-10-CM | POA: Insufficient documentation

## 2016-02-22 ENCOUNTER — Encounter (HOSPITAL_COMMUNITY): Payer: 59

## 2016-02-25 ENCOUNTER — Encounter (HOSPITAL_COMMUNITY): Payer: 59

## 2016-02-27 ENCOUNTER — Encounter (HOSPITAL_COMMUNITY)
Admission: RE | Admit: 2016-02-27 | Discharge: 2016-02-27 | Disposition: A | Discharge status: Home / Self Care | Payer: 59 | Source: Ambulatory Visit | Attending: Cardiovascular Disease | Admitting: Cardiovascular Disease

## 2016-02-27 DIAGNOSIS — I213 ST elevation (STEMI) myocardial infarction of unspecified site: Secondary | ICD-10-CM | POA: Diagnosis not present

## 2016-02-29 ENCOUNTER — Encounter (HOSPITAL_COMMUNITY)
Admission: RE | Admit: 2016-02-29 | Discharge: 2016-02-29 | Disposition: A | Discharge status: Home / Self Care | Payer: 59 | Source: Ambulatory Visit | Attending: Cardiovascular Disease | Admitting: Cardiovascular Disease

## 2016-02-29 DIAGNOSIS — I213 ST elevation (STEMI) myocardial infarction of unspecified site: Secondary | ICD-10-CM | POA: Diagnosis not present

## 2016-03-03 ENCOUNTER — Encounter (HOSPITAL_COMMUNITY): Payer: 59

## 2016-03-05 ENCOUNTER — Encounter (HOSPITAL_COMMUNITY): Payer: 59

## 2016-03-05 ENCOUNTER — Telehealth (HOSPITAL_COMMUNITY): Payer: Self-pay | Admitting: Internal Medicine

## 2016-03-07 ENCOUNTER — Encounter (HOSPITAL_COMMUNITY): Payer: 59

## 2016-03-10 ENCOUNTER — Encounter (HOSPITAL_COMMUNITY)
Admission: RE | Admit: 2016-03-10 | Discharge: 2016-03-10 | Disposition: A | Discharge status: Home / Self Care | Payer: 59 | Source: Ambulatory Visit | Attending: Cardiovascular Disease | Admitting: Cardiovascular Disease

## 2016-03-10 DIAGNOSIS — I213 ST elevation (STEMI) myocardial infarction of unspecified site: Secondary | ICD-10-CM | POA: Diagnosis not present

## 2016-03-12 ENCOUNTER — Encounter (HOSPITAL_COMMUNITY)
Admission: RE | Admit: 2016-03-12 | Discharge: 2016-03-12 | Disposition: A | Discharge status: Home / Self Care | Payer: 59 | Source: Ambulatory Visit | Attending: Cardiovascular Disease | Admitting: Cardiovascular Disease

## 2016-03-12 DIAGNOSIS — I213 ST elevation (STEMI) myocardial infarction of unspecified site: Secondary | ICD-10-CM | POA: Diagnosis not present

## 2016-03-14 ENCOUNTER — Encounter (HOSPITAL_COMMUNITY): Payer: 59

## 2016-03-17 ENCOUNTER — Encounter (HOSPITAL_COMMUNITY)
Admission: RE | Admit: 2016-03-17 | Discharge: 2016-03-17 | Disposition: A | Discharge status: Home / Self Care | Payer: 59 | Source: Ambulatory Visit | Attending: Cardiovascular Disease | Admitting: Cardiovascular Disease

## 2016-03-17 DIAGNOSIS — I213 ST elevation (STEMI) myocardial infarction of unspecified site: Secondary | ICD-10-CM | POA: Diagnosis not present

## 2016-03-19 ENCOUNTER — Encounter (HOSPITAL_COMMUNITY): Payer: 59

## 2016-03-21 ENCOUNTER — Encounter (HOSPITAL_COMMUNITY)
Admission: RE | Admit: 2016-03-21 | Discharge: 2016-03-21 | Disposition: A | Discharge status: Home / Self Care | Payer: 59 | Source: Ambulatory Visit | Attending: Cardiovascular Disease | Admitting: Cardiovascular Disease

## 2016-03-21 DIAGNOSIS — I213 ST elevation (STEMI) myocardial infarction of unspecified site: Secondary | ICD-10-CM | POA: Diagnosis not present

## 2016-03-24 ENCOUNTER — Encounter (HOSPITAL_COMMUNITY)
Admission: RE | Admit: 2016-03-24 | Discharge: 2016-03-24 | Disposition: A | Discharge status: Home / Self Care | Payer: 59 | Source: Ambulatory Visit | Attending: Cardiovascular Disease | Admitting: Cardiovascular Disease

## 2016-03-24 DIAGNOSIS — I213 ST elevation (STEMI) myocardial infarction of unspecified site: Secondary | ICD-10-CM | POA: Diagnosis not present

## 2016-03-24 DIAGNOSIS — Z955 Presence of coronary angioplasty implant and graft: Secondary | ICD-10-CM | POA: Insufficient documentation

## 2016-03-26 ENCOUNTER — Encounter (HOSPITAL_COMMUNITY): Payer: 59

## 2016-03-28 ENCOUNTER — Encounter (HOSPITAL_COMMUNITY): Payer: 59

## 2016-03-31 ENCOUNTER — Encounter (HOSPITAL_COMMUNITY)
Admission: RE | Admit: 2016-03-31 | Discharge: 2016-03-31 | Disposition: A | Discharge status: Home / Self Care | Payer: 59 | Source: Ambulatory Visit | Attending: Cardiovascular Disease | Admitting: Cardiovascular Disease

## 2016-03-31 DIAGNOSIS — I213 ST elevation (STEMI) myocardial infarction of unspecified site: Secondary | ICD-10-CM | POA: Diagnosis not present

## 2016-04-02 ENCOUNTER — Encounter (HOSPITAL_COMMUNITY)
Admission: RE | Admit: 2016-04-02 | Discharge: 2016-04-02 | Disposition: A | Discharge status: Home / Self Care | Payer: 59 | Source: Ambulatory Visit | Attending: Cardiovascular Disease | Admitting: Cardiovascular Disease

## 2016-04-02 DIAGNOSIS — I213 ST elevation (STEMI) myocardial infarction of unspecified site: Secondary | ICD-10-CM | POA: Diagnosis not present

## 2016-04-04 ENCOUNTER — Encounter (HOSPITAL_COMMUNITY): Admission: RE | Admit: 2016-04-04 | Payer: 59 | Source: Ambulatory Visit

## 2016-04-07 ENCOUNTER — Encounter (HOSPITAL_COMMUNITY)
Admission: RE | Admit: 2016-04-07 | Discharge: 2016-04-07 | Disposition: A | Discharge status: Home / Self Care | Payer: 59 | Source: Ambulatory Visit | Attending: Cardiovascular Disease | Admitting: Cardiovascular Disease

## 2016-04-07 DIAGNOSIS — I213 ST elevation (STEMI) myocardial infarction of unspecified site: Secondary | ICD-10-CM | POA: Diagnosis not present

## 2016-04-09 ENCOUNTER — Encounter (HOSPITAL_COMMUNITY)
Admission: RE | Admit: 2016-04-09 | Discharge: 2016-04-09 | Disposition: A | Discharge status: Home / Self Care | Payer: 59 | Source: Ambulatory Visit | Attending: Cardiovascular Disease | Admitting: Cardiovascular Disease

## 2016-04-09 DIAGNOSIS — I213 ST elevation (STEMI) myocardial infarction of unspecified site: Secondary | ICD-10-CM | POA: Diagnosis not present

## 2016-04-11 ENCOUNTER — Encounter (HOSPITAL_COMMUNITY)
Admission: RE | Admit: 2016-04-11 | Discharge: 2016-04-11 | Disposition: A | Discharge status: Home / Self Care | Payer: 59 | Source: Ambulatory Visit | Attending: Cardiovascular Disease | Admitting: Cardiovascular Disease

## 2016-04-11 DIAGNOSIS — I213 ST elevation (STEMI) myocardial infarction of unspecified site: Secondary | ICD-10-CM | POA: Diagnosis not present

## 2016-04-14 ENCOUNTER — Encounter (HOSPITAL_COMMUNITY): Payer: 59

## 2016-04-16 ENCOUNTER — Encounter (HOSPITAL_COMMUNITY): Payer: 59

## 2016-04-18 ENCOUNTER — Encounter (HOSPITAL_COMMUNITY): Payer: 59

## 2016-04-21 ENCOUNTER — Encounter (HOSPITAL_COMMUNITY)
Admission: RE | Admit: 2016-04-21 | Discharge: 2016-04-21 | Disposition: A | Discharge status: Home / Self Care | Payer: 59 | Source: Ambulatory Visit | Attending: Cardiovascular Disease | Admitting: Cardiovascular Disease

## 2016-04-21 DIAGNOSIS — Z955 Presence of coronary angioplasty implant and graft: Secondary | ICD-10-CM | POA: Diagnosis not present

## 2016-04-21 DIAGNOSIS — I213 ST elevation (STEMI) myocardial infarction of unspecified site: Secondary | ICD-10-CM | POA: Insufficient documentation

## 2016-04-23 ENCOUNTER — Encounter (HOSPITAL_COMMUNITY)
Admission: RE | Admit: 2016-04-23 | Discharge: 2016-04-23 | Disposition: A | Discharge status: Home / Self Care | Payer: 59 | Source: Ambulatory Visit | Attending: Cardiovascular Disease | Admitting: Cardiovascular Disease

## 2016-04-23 DIAGNOSIS — I213 ST elevation (STEMI) myocardial infarction of unspecified site: Secondary | ICD-10-CM | POA: Diagnosis not present

## 2016-04-25 ENCOUNTER — Encounter (HOSPITAL_COMMUNITY): Payer: 59

## 2016-04-28 ENCOUNTER — Encounter (HOSPITAL_COMMUNITY)
Admission: RE | Admit: 2016-04-28 | Discharge: 2016-04-28 | Disposition: A | Discharge status: Home / Self Care | Payer: 59 | Source: Ambulatory Visit | Attending: Cardiovascular Disease | Admitting: Cardiovascular Disease

## 2016-04-28 DIAGNOSIS — I213 ST elevation (STEMI) myocardial infarction of unspecified site: Secondary | ICD-10-CM | POA: Diagnosis not present

## 2016-04-30 ENCOUNTER — Encounter (HOSPITAL_COMMUNITY)
Admission: RE | Admit: 2016-04-30 | Discharge: 2016-04-30 | Disposition: A | Discharge status: Home / Self Care | Payer: 59 | Source: Ambulatory Visit | Attending: Cardiovascular Disease | Admitting: Cardiovascular Disease

## 2016-04-30 DIAGNOSIS — I213 ST elevation (STEMI) myocardial infarction of unspecified site: Secondary | ICD-10-CM | POA: Diagnosis not present

## 2016-05-02 ENCOUNTER — Encounter (HOSPITAL_COMMUNITY)
Admission: RE | Admit: 2016-05-02 | Discharge: 2016-05-02 | Disposition: A | Discharge status: Home / Self Care | Payer: 59 | Source: Ambulatory Visit | Attending: Cardiovascular Disease | Admitting: Cardiovascular Disease

## 2016-05-02 DIAGNOSIS — I213 ST elevation (STEMI) myocardial infarction of unspecified site: Secondary | ICD-10-CM | POA: Diagnosis not present

## 2016-05-05 ENCOUNTER — Encounter (HOSPITAL_COMMUNITY)
Admission: RE | Admit: 2016-05-05 | Discharge: 2016-05-05 | Disposition: A | Discharge status: Home / Self Care | Payer: 59 | Source: Ambulatory Visit | Attending: Cardiovascular Disease | Admitting: Cardiovascular Disease

## 2016-05-05 DIAGNOSIS — I213 ST elevation (STEMI) myocardial infarction of unspecified site: Secondary | ICD-10-CM | POA: Diagnosis not present

## 2016-05-07 ENCOUNTER — Encounter (HOSPITAL_COMMUNITY)
Admission: RE | Admit: 2016-05-07 | Discharge: 2016-05-07 | Disposition: A | Discharge status: Home / Self Care | Payer: 59 | Source: Ambulatory Visit | Attending: Cardiovascular Disease | Admitting: Cardiovascular Disease

## 2016-05-07 DIAGNOSIS — I213 ST elevation (STEMI) myocardial infarction of unspecified site: Secondary | ICD-10-CM | POA: Diagnosis not present

## 2016-05-09 ENCOUNTER — Encounter (HOSPITAL_COMMUNITY): Payer: 59

## 2016-05-12 ENCOUNTER — Encounter (HOSPITAL_COMMUNITY)
Admission: RE | Admit: 2016-05-12 | Discharge: 2016-05-12 | Disposition: A | Discharge status: Home / Self Care | Payer: 59 | Source: Ambulatory Visit | Attending: Cardiovascular Disease | Admitting: Cardiovascular Disease

## 2016-05-12 ENCOUNTER — Encounter (HOSPITAL_COMMUNITY): Payer: 59

## 2016-05-12 DIAGNOSIS — I213 ST elevation (STEMI) myocardial infarction of unspecified site: Secondary | ICD-10-CM | POA: Diagnosis not present

## 2016-05-14 ENCOUNTER — Encounter (HOSPITAL_COMMUNITY): Payer: 59

## 2016-05-16 ENCOUNTER — Encounter (HOSPITAL_COMMUNITY): Payer: 59

## 2016-05-21 ENCOUNTER — Encounter (HOSPITAL_COMMUNITY): Payer: 59

## 2016-05-23 ENCOUNTER — Encounter (HOSPITAL_COMMUNITY): Payer: 59

## 2016-05-26 ENCOUNTER — Encounter (HOSPITAL_COMMUNITY): Payer: 59

## 2016-05-26 ENCOUNTER — Encounter (HOSPITAL_COMMUNITY)
Admission: RE | Admit: 2016-05-26 | Discharge: 2016-05-26 | Disposition: A | Discharge status: Home / Self Care | Payer: 59 | Source: Ambulatory Visit | Attending: Cardiovascular Disease | Admitting: Cardiovascular Disease

## 2016-05-26 DIAGNOSIS — I213 ST elevation (STEMI) myocardial infarction of unspecified site: Secondary | ICD-10-CM | POA: Insufficient documentation

## 2016-05-26 DIAGNOSIS — Z955 Presence of coronary angioplasty implant and graft: Secondary | ICD-10-CM | POA: Diagnosis not present

## 2016-05-28 ENCOUNTER — Encounter (HOSPITAL_COMMUNITY)
Admission: RE | Admit: 2016-05-28 | Discharge: 2016-05-28 | Disposition: A | Discharge status: Home / Self Care | Payer: 59 | Source: Ambulatory Visit | Attending: Cardiovascular Disease | Admitting: Cardiovascular Disease

## 2016-05-28 ENCOUNTER — Encounter (HOSPITAL_COMMUNITY): Payer: 59

## 2016-05-28 DIAGNOSIS — I213 ST elevation (STEMI) myocardial infarction of unspecified site: Secondary | ICD-10-CM | POA: Diagnosis not present

## 2016-05-28 NOTE — Progress Notes (Addendum)
Pt graduated from cardiac rehab program  with completion of 33 exercise sessions in Phase II on Friday. Pt maintained good attendance and progressed nicely during his participation in rehab as evidenced by increased MET level.   Medication list reconciled. Repeat  PHQ score- 0 .  Pt has made significant lifestyle changes and should be commended for his success. Pt feels he has achieved his goals during cardiac rehab.   Pt plans to continue exercise at the [facility].Barnet Pall, RN,BSN 05/28/2016 4:39 PM

## 2016-05-30 ENCOUNTER — Encounter (HOSPITAL_COMMUNITY): Admission: RE | Admit: 2016-05-30 | Payer: 59 | Source: Ambulatory Visit

## 2016-05-30 ENCOUNTER — Encounter (HOSPITAL_COMMUNITY)
Admission: RE | Admit: 2016-05-30 | Discharge: 2016-05-30 | Disposition: A | Discharge status: Home / Self Care | Payer: 59 | Source: Ambulatory Visit | Attending: Cardiovascular Disease | Admitting: Cardiovascular Disease

## 2016-05-30 DIAGNOSIS — I213 ST elevation (STEMI) myocardial infarction of unspecified site: Secondary | ICD-10-CM | POA: Diagnosis not present

## 2017-01-08 ENCOUNTER — Other Ambulatory Visit: Payer: Self-pay | Admitting: Cardiovascular Disease

## 2017-01-08 DIAGNOSIS — I2510 Atherosclerotic heart disease of native coronary artery without angina pectoris: Secondary | ICD-10-CM

## 2017-01-08 DIAGNOSIS — E785 Hyperlipidemia, unspecified: Secondary | ICD-10-CM

## 2017-02-27 ENCOUNTER — Encounter: Payer: Self-pay | Admitting: *Deleted

## 2017-03-06 ENCOUNTER — Encounter (INDEPENDENT_AMBULATORY_CARE_PROVIDER_SITE_OTHER): Payer: Self-pay

## 2017-03-06 ENCOUNTER — Encounter: Payer: Self-pay | Admitting: Cardiovascular Disease

## 2017-03-06 ENCOUNTER — Ambulatory Visit (INDEPENDENT_AMBULATORY_CARE_PROVIDER_SITE_OTHER): Payer: 59 | Admitting: Cardiovascular Disease

## 2017-03-06 DIAGNOSIS — I2510 Atherosclerotic heart disease of native coronary artery without angina pectoris: Secondary | ICD-10-CM | POA: Diagnosis not present

## 2017-03-06 DIAGNOSIS — E785 Hyperlipidemia, unspecified: Secondary | ICD-10-CM | POA: Diagnosis not present

## 2017-03-06 NOTE — Progress Notes (Signed)
Cardiology Office Note Date:  03/06/2017   ID:  Francis Foster, DOB [birth year], MRN [id number]  PCP:  Geoffery Lyons, MD  Cardiologist:  Sherren Mocha, MD    Chief Complaint  Patient presents with  . Coronary Artery Disease   History of Present Illness: Francis Foster is a 56 y.o. male who presents for follow-up evaluation. He was seen one year ago in initial consultation for new onset exertional angina. An exercise stress Myoview scan demonstrated a hypertensive response to exercise, and a reversible ischemic defect in the anterolateral and apical walls. Cardiac catheterization was performed and this demonstrated severe stenosis of the first diagonal branch. The patient was treated with PCI using a single drug-eluting stent. There was no other obstructive coronary disease identified. The patient returns today for follow-up evaluation.  The patient is here alone today. He is doing well with no symptoms of chest pain, chest pressure, or shortness of breath. He hasn't been as physically active and attributes this to the weather. He has gained some weight. No specific complaints today. He's tolerating his medications well and reports good compliance.   Past Medical History:  Diagnosis Date  . Abnormal nuclear stress test 12/28/2015  . Benign essential HTN   . CAD (coronary artery disease), native coronary artery 12/29/2015   S/p DES to ostial D1  . Chest discomfort   . Chest tightness   . Depression   . Dizziness   . IGT (impaired glucose tolerance)   . Lightheaded   . Morbid obesity ([place])   . Unstable angina [facility])     Past Surgical History:  Procedure Laterality Date  . CARDIAC CATHETERIZATION N/A 12/28/2015   Procedure: Left Heart Cath and Coronary Angiography;  Surgeon: Sherren Mocha, MD;  Location: [place];  Service: Cardiovascular;  Laterality: N/A;  . HYPOSPADIAS CORRECTION     56 years old    Current Outpatient Prescriptions  Medication Sig Dispense Refill   . aspirin 81 MG tablet Take 81 mg by mouth daily.    Marland Kitchen atorvastatin (LIPITOR) 80 MG tablet Take 1 tablet (80 mg total) by mouth daily at 6 PM. 90 tablet 3  . BYSTOLIC 10 MG tablet TAKE ONE TABLET BY MOUTH ONCE DAILY 90 tablet 0  . lisinopril-hydrochlorothiazide (PRINZIDE,ZESTORETIC) 20-12.5 MG tablet Take 1 tablet by mouth daily. 90 tablet 3  . nitroGLYCERIN (NITROSTAT) 0.4 MG SL tablet Place 1 tablet (0.4 mg total) under the tongue every 5 (five) minutes as needed for chest pain. 25 tablet 2   No current facility-administered medications for this visit.     Allergies:   Patient has no known allergies.   Social History:  The patient  reports that he has never smoked. He has never used smokeless tobacco. He reports that he drinks alcohol. He reports that he does not use drugs.   Family History:  The patient's  family history includes Healthy in his sister; Neuropathy in his father; Osteoarthritis in his mother.    ROS:  Please see the history of present illness.  All other systems are reviewed and negative.    PHYSICAL EXAM: VS:  BP 120/78   Pulse (!) 56   Ht 5' 11.75" (1.822 m)   Wt (!) 313 lb 9.6 oz (142.2 kg)   BMI 42.83 kg/m  , BMI Body mass index is 42.83 kg/m. GEN: Well nourished, well developed, obese male in  no acute distress  HEENT: normal  Neck: no JVD, no masses. No carotid bruits  Cardiac: Bradycardic and regular without murmur or gallop                Respiratory:  clear to auscultation bilaterally, normal work of breathing GI: soft, nontender, nondistended, + BS, obese MS: no deformity or atrophy  Ext: trace pretibial edema, pedal pulses 2+= bilaterally Skin: warm and dry, no rash Neuro:  Strength and sensation are intact Psych: euthymic mood, full affect  EKG:  EKG is ordered today. The ekg ordered today shows sinus bradycardia 54 bpm, otherwise within normal limits.  Recent Labs: No results found for requested labs within last 8760 hours.   Lipid Panel    No results found for: CHOL, TRIG, HDL, CHOLHDL, VLDL, LDLCALC, LDLDIRECT    Wt Readings from Last 3 Encounters:  03/06/17 (!) 313 lb 9.6 oz (142.2 kg)  01/24/16 (!) 305 lb 1.9 oz (138.4 kg)  01/11/16 (!) 310 lb 6.4 oz (140.8 kg)     Cardiac Studies Reviewed: Cardiac Cath 12-28-2015: Conclusion   1. Severe diagonal stenosis with successful PCI using a DES platform. 2. No significant stenosis in the RCA, LCx, or LAD proper  Recommend: ASA, Plavix x 12 months, aggressive risk reduction.     ASSESSMENT AND PLAN: 1.  Coronary artery disease, native vessel, without symptoms of angina: The patient is doing well now greater than one year out from PCI. He is instructed to discontinue Plavix. He should remain on lifelong aspirin 81 mg. He continues on a risk reduction program including atorvastatin, nebivolol, and lisinopril.  2. Hypertension, controlled: Continue lisinopril/hydrochlorothiazide and nebivolol.  3. Hyperlipidemia: Treated with a high intensity statin drug with atorvastatin 80 mg. Lipids are followed by his primary care physician.  4. Morbid obesity: BMI greater than 40. Diet, exercise, and lifestyle modification strategies are reviewed with the patient.  Current medicines are reviewed with the patient today.  The patient does not have concerns regarding medicines.  Labs/ tests ordered today include:   Orders Placed This Encounter  Procedures  . EKG 12-Lead    Disposition:   FU one year  Signed, Sherren Mocha, MD  03/06/2017 5:26 PM    [facility] [place], [place], [place]  [address] Phone: [phone]; Fax: [phone]

## 2017-03-06 NOTE — Patient Instructions (Signed)
Medication Instructions:  Your physician has recommended you make the following change in your medication:  1. STOP Plavix (clopidogrel)  Labwork: No new orders.   Testing/Procedures: No new orders.   Follow-Up: Your physician wants you to follow-up in: 1 YEAR with Dr Cooper.  You will receive a reminder letter in the mail two months in advance. If you don't receive a letter, please call our office to schedule the follow-up appointment.   Any Other Special Instructions Will Be Listed Below (If Applicable).     If you need a refill on your cardiac medications before your next appointment, please call your pharmacy.   

## 2017-03-12 ENCOUNTER — Other Ambulatory Visit: Payer: Self-pay | Admitting: Cardiovascular Disease

## 2017-03-12 DIAGNOSIS — I2510 Atherosclerotic heart disease of native coronary artery without angina pectoris: Secondary | ICD-10-CM

## 2017-03-12 DIAGNOSIS — E785 Hyperlipidemia, unspecified: Secondary | ICD-10-CM

## 2017-04-13 DIAGNOSIS — R7302 Impaired glucose tolerance (oral): Secondary | ICD-10-CM | POA: Diagnosis not present

## 2017-04-13 DIAGNOSIS — I200 Unstable angina: Secondary | ICD-10-CM | POA: Diagnosis not present

## 2017-04-13 DIAGNOSIS — R0789 Other chest pain: Secondary | ICD-10-CM | POA: Diagnosis not present

## 2017-04-13 DIAGNOSIS — Z1389 Encounter for screening for other disorder: Secondary | ICD-10-CM | POA: Diagnosis not present

## 2017-09-10 DIAGNOSIS — L821 Other seborrheic keratosis: Secondary | ICD-10-CM | POA: Diagnosis not present

## 2017-09-10 DIAGNOSIS — D2261 Melanocytic nevi of right upper limb, including shoulder: Secondary | ICD-10-CM | POA: Diagnosis not present

## 2017-09-10 DIAGNOSIS — D2262 Melanocytic nevi of left upper limb, including shoulder: Secondary | ICD-10-CM | POA: Diagnosis not present

## 2017-09-10 DIAGNOSIS — D225 Melanocytic nevi of trunk: Secondary | ICD-10-CM | POA: Diagnosis not present

## 2017-09-10 DIAGNOSIS — D485 Neoplasm of uncertain behavior of skin: Secondary | ICD-10-CM | POA: Diagnosis not present

## 2017-10-08 DIAGNOSIS — Z0000 Encounter for general adult medical examination without abnormal findings: Secondary | ICD-10-CM | POA: Diagnosis not present

## 2017-10-08 DIAGNOSIS — Z7689 Persons encountering health services in other specified circumstances: Secondary | ICD-10-CM | POA: Diagnosis not present

## 2017-10-08 DIAGNOSIS — Z125 Encounter for screening for malignant neoplasm of prostate: Secondary | ICD-10-CM | POA: Diagnosis not present

## 2017-10-13 DIAGNOSIS — Z23 Encounter for immunization: Secondary | ICD-10-CM | POA: Diagnosis not present

## 2017-10-16 DIAGNOSIS — I208 Other forms of angina pectoris: Secondary | ICD-10-CM | POA: Diagnosis not present

## 2017-10-16 DIAGNOSIS — Z0000 Encounter for general adult medical examination without abnormal findings: Secondary | ICD-10-CM | POA: Diagnosis not present

## 2017-10-16 DIAGNOSIS — R0789 Other chest pain: Secondary | ICD-10-CM | POA: Diagnosis not present

## 2017-10-16 DIAGNOSIS — I10 Essential (primary) hypertension: Secondary | ICD-10-CM | POA: Diagnosis not present

## 2017-10-16 DIAGNOSIS — R7302 Impaired glucose tolerance (oral): Secondary | ICD-10-CM | POA: Diagnosis not present

## 2017-10-19 DIAGNOSIS — M25561 Pain in right knee: Secondary | ICD-10-CM | POA: Diagnosis not present

## 2017-10-19 DIAGNOSIS — Z1212 Encounter for screening for malignant neoplasm of rectum: Secondary | ICD-10-CM | POA: Diagnosis not present

## 2018-01-25 ENCOUNTER — Other Ambulatory Visit: Payer: Self-pay | Admitting: Cardiovascular Disease

## 2018-01-25 DIAGNOSIS — E785 Hyperlipidemia, unspecified: Secondary | ICD-10-CM

## 2018-01-25 DIAGNOSIS — I2510 Atherosclerotic heart disease of native coronary artery without angina pectoris: Secondary | ICD-10-CM

## 2018-03-11 ENCOUNTER — Encounter: Payer: Self-pay | Admitting: Cardiovascular Disease

## 2018-03-22 ENCOUNTER — Ambulatory Visit (INDEPENDENT_AMBULATORY_CARE_PROVIDER_SITE_OTHER): Payer: 59 | Admitting: Cardiovascular Disease

## 2018-03-22 ENCOUNTER — Encounter: Payer: Self-pay | Admitting: Cardiovascular Disease

## 2018-03-22 DIAGNOSIS — I2510 Atherosclerotic heart disease of native coronary artery without angina pectoris: Secondary | ICD-10-CM

## 2018-03-22 DIAGNOSIS — E782 Mixed hyperlipidemia: Secondary | ICD-10-CM

## 2018-03-22 DIAGNOSIS — I10 Essential (primary) hypertension: Secondary | ICD-10-CM | POA: Diagnosis not present

## 2018-03-22 MED ORDER — NITROGLYCERIN 0.4 MG SL SUBL: 0.4000 mg | SUBLINGUAL_TABLET | SUBLINGUAL | 2 refills | Status: DC | PRN

## 2018-03-22 MED ORDER — LISINOPRIL-HYDROCHLOROTHIAZIDE 20-12.5 MG PO TABS: 1.0000 | ORAL_TABLET | Freq: Every day | ORAL | 3 refills | Status: DC

## 2018-03-22 MED ORDER — NEBIVOLOL HCL 10 MG PO TABS
10.0000 mg | ORAL_TABLET | Freq: Every day | ORAL | 3 refills | Status: DC
Start: 2018-03-22 — End: 2019-04-11

## 2018-03-22 MED ORDER — ATORVASTATIN CALCIUM 80 MG PO TABS: 80.0000 mg | ORAL_TABLET | Freq: Every day | ORAL | 3 refills | Status: DC

## 2018-03-22 NOTE — Patient Instructions (Signed)

## 2018-03-22 NOTE — Progress Notes (Signed)
Cardiology Office Note Date:  03/22/2018   ID:  CREG GILMER, DOB 1961-09-02, MRN [id number]  PCP:  Burnard Bunting, MD  Cardiologist:  Sherren Mocha, MD    Chief Complaint  Patient presents with  . Coronary Artery Disease     History of Present Illness: Francis Foster is a 57 y.o. male who presents for follow-up of coronary artery disease.  He initially presented in 2017 with exertional angina and was found to have an abnormal nuclear stress test in the anterolateral and apical walls.  Cardiac catheterization demonstrated severe single-vessel coronary artery disease involving the first diagonal branch and he was treated with PCI using a drug-eluting stent.  When he was seen last year he discontinued Plavix.  He continued on aspirin, high intensity statin drug, a beta-blocker, and an ACE inhibitor.  He returns today for one-year follow-up.  The patient is here alone today.  He has been feeling fine.  Complains of a feeling of cold feet and wants to make sure he does not have peripheral arterial disease.  He denies any calf claudication symptoms.  He denies chest pain, shortness of breath, leg swelling, or heart palpitations.  He recently has resumed his walking program.  He walks with a couple friends and they do 4 miles around [place] over about an hour.  He has no symptoms without level of activity.   Past Medical History:  Diagnosis Date  . Abnormal nuclear stress test 12/28/2015  . Benign essential HTN   . CAD (coronary artery disease), native coronary artery 12/29/2015   S/p DES to ostial D1  . Chest discomfort   . Chest tightness   . Depression   . Dizziness   . IGT (impaired glucose tolerance)   . Lightheaded   . Morbid obesity ([place])   . Unstable angina [facility])     Past Surgical History:  Procedure Laterality Date  . CARDIAC CATHETERIZATION N/A 12/28/2015   Procedure: Left Heart Cath and Coronary Angiography;  Surgeon: Sherren Mocha, MD;  Location: [place] [facility];  Service: Cardiovascular;  Laterality: N/A;  . HYPOSPADIAS CORRECTION     57 years old    Current Outpatient Medications  Medication Sig Dispense Refill  . aspirin 81 MG tablet Take 81 mg by mouth daily.    Marland Kitchen atorvastatin (LIPITOR) 80 MG tablet Take 1 tablet (80 mg total) by mouth daily at 6 PM. 90 tablet 3  . BYSTOLIC 10 MG tablet TAKE ONE TABLET BY MOUTH ONCE DAILY 90 tablet 0  . lisinopril-hydrochlorothiazide (PRINZIDE,ZESTORETIC) 20-12.5 MG tablet Take 1 tablet by mouth daily. 90 tablet 3  . nitroGLYCERIN (NITROSTAT) 0.4 MG SL tablet Place 1 tablet (0.4 mg total) under the tongue every 5 (five) minutes as needed for chest pain. 25 tablet 2   No current facility-administered medications for this visit.     Allergies:   Patient has no known allergies.   Social History:  The patient  reports that he has never smoked. He has never used smokeless tobacco. He reports that he drinks alcohol. He reports that he does not use drugs.   Family History:  The patient's  family history includes Healthy in his sister; Neuropathy in his father; Osteoarthritis in his mother.   ROS:  Please see the history of present illness.  All other systems are reviewed and negative.   PHYSICAL EXAM: VS:  Ht 5' 11.75" (1.822 m)   BMI 42.83 kg/m  , BMI Body mass index is 42.83  kg/m. GEN: Well nourished, well developed, pleasant obese male in no acute distress  HEENT: normal  Neck: no JVD, no masses. No carotid bruits Cardiac: RRR without murmur or gallop      Respiratory:  clear to auscultation bilaterally, normal work of breathing GI: soft, nontender, nondistended, + BS MS: no deformity or atrophy  Ext: no pretibial edema, pedal pulses 2+= bilaterally Skin: warm and dry, no rash Neuro:  Strength and sensation are intact Psych: euthymic mood, full affect  EKG:  EKG is ordered today. The ekg ordered today shows normal sinus rhythm 71 bpm, within normal limits.  Recent Labs: No results found for  requested labs within last 8760 hours.   Lipid Panel  No results found for: CHOL, TRIG, HDL, CHOLHDL, VLDL, LDLCALC, LDLDIRECT    Wt Readings from Last 3 Encounters:  03/06/17 (!) 313 lb 9.6 oz (142.2 kg)  01/24/16 (!) 305 lb 1.9 oz (138.4 kg)  01/11/16 (!) 310 lb 6.4 oz (140.8 kg)     ASSESSMENT AND PLAN: 1.  Coronary artery disease, native vessel, without angina: The patient is stable on his current medical program which includes aspirin 81 mg, atorvastatin, and a beta-blocker.  2.  Hyperlipidemia: He is treated with atorvastatin 80 mg.  His most recent lipids are reviewed with a cholesterol of 121, LDL 46, HDL 38, and triglycerides 184.  3.  Hypertension: Continue treatment with lisinopril, hydrochlorothiazide, and nebivolol.  4.  Morbid obesity BMI greater than 40: Lengthy discussion about weight loss measures.  We discussed specifics of dietary strategies and increased exercise.  Current medicines are reviewed with the patient today.  The patient does not have concerns regarding medicines.  Labs/ tests ordered today include:  No orders of the defined types were placed in this encounter.  Disposition:   FU one year  Signed, Sherren Mocha, MD  03/22/2018 1:57 PM    [facility] [place], [place], [place]  [address] Phone: [phone]; Fax: [phone]

## 2018-03-23 DIAGNOSIS — J209 Acute bronchitis, unspecified: Secondary | ICD-10-CM | POA: Diagnosis not present

## 2018-04-22 DIAGNOSIS — G609 Hereditary and idiopathic neuropathy, unspecified: Secondary | ICD-10-CM | POA: Diagnosis not present

## 2018-04-22 DIAGNOSIS — Z1389 Encounter for screening for other disorder: Secondary | ICD-10-CM | POA: Diagnosis not present

## 2018-04-22 DIAGNOSIS — R7302 Impaired glucose tolerance (oral): Secondary | ICD-10-CM | POA: Diagnosis not present

## 2018-04-22 DIAGNOSIS — I10 Essential (primary) hypertension: Secondary | ICD-10-CM | POA: Diagnosis not present

## 2018-10-13 DIAGNOSIS — Z0000 Encounter for general adult medical examination without abnormal findings: Secondary | ICD-10-CM | POA: Diagnosis not present

## 2018-10-13 DIAGNOSIS — R82998 Other abnormal findings in urine: Secondary | ICD-10-CM | POA: Diagnosis not present

## 2018-10-13 DIAGNOSIS — Z125 Encounter for screening for malignant neoplasm of prostate: Secondary | ICD-10-CM | POA: Diagnosis not present

## 2018-10-13 DIAGNOSIS — Z23 Encounter for immunization: Secondary | ICD-10-CM | POA: Diagnosis not present

## 2018-10-20 DIAGNOSIS — Z0000 Encounter for general adult medical examination without abnormal findings: Secondary | ICD-10-CM | POA: Diagnosis not present

## 2018-10-20 DIAGNOSIS — R7302 Impaired glucose tolerance (oral): Secondary | ICD-10-CM | POA: Diagnosis not present

## 2018-10-20 DIAGNOSIS — R0789 Other chest pain: Secondary | ICD-10-CM | POA: Diagnosis not present

## 2018-10-20 DIAGNOSIS — I10 Essential (primary) hypertension: Secondary | ICD-10-CM | POA: Diagnosis not present

## 2018-10-28 DIAGNOSIS — L821 Other seborrheic keratosis: Secondary | ICD-10-CM | POA: Diagnosis not present

## 2018-10-28 DIAGNOSIS — L814 Other melanin hyperpigmentation: Secondary | ICD-10-CM | POA: Diagnosis not present

## 2018-10-28 DIAGNOSIS — D2262 Melanocytic nevi of left upper limb, including shoulder: Secondary | ICD-10-CM | POA: Diagnosis not present

## 2018-10-28 DIAGNOSIS — D0361 Melanoma in situ of right upper limb, including shoulder: Secondary | ICD-10-CM | POA: Diagnosis not present

## 2018-11-03 ENCOUNTER — Other Ambulatory Visit: Payer: Self-pay | Admitting: Cardiovascular Disease

## 2018-11-03 DIAGNOSIS — E785 Hyperlipidemia, unspecified: Secondary | ICD-10-CM

## 2018-11-03 DIAGNOSIS — I2510 Atherosclerotic heart disease of native coronary artery without angina pectoris: Secondary | ICD-10-CM

## 2018-11-03 NOTE — Telephone Encounter (Signed)
Outpatient Medication Detail    Disp Refills Start End   nebivolol (BYSTOLIC) 10 MG tablet 90 tablet 3 03/22/2018    Sig - Route: Take 1 tablet (10 mg total) by mouth daily. - Oral   Sent to pharmacy as: nebivolol (BYSTOLIC) 10 MG tablet   E-Prescribing Status: Receipt confirmed by pharmacy (03/22/2018 2:15 PM EDT)   Associated Diagnoses   Coronary artery disease involving native coronary artery of native heart without angina pectoris     Mixed hyperlipidemia     Pharmacy   [place], [place] - [address]

## 2018-11-11 DIAGNOSIS — L988 Other specified disorders of the skin and subcutaneous tissue: Secondary | ICD-10-CM | POA: Diagnosis not present

## 2018-11-11 DIAGNOSIS — D0361 Melanoma in situ of right upper limb, including shoulder: Secondary | ICD-10-CM | POA: Diagnosis not present

## 2019-03-23 ENCOUNTER — Ambulatory Visit: Payer: 59 | Admitting: Cardiovascular Disease

## 2019-04-09 ENCOUNTER — Other Ambulatory Visit: Payer: Self-pay | Admitting: Cardiovascular Disease

## 2019-04-09 DIAGNOSIS — E782 Mixed hyperlipidemia: Secondary | ICD-10-CM

## 2019-04-09 DIAGNOSIS — I2510 Atherosclerotic heart disease of native coronary artery without angina pectoris: Secondary | ICD-10-CM

## 2019-04-27 DIAGNOSIS — I10 Essential (primary) hypertension: Secondary | ICD-10-CM | POA: Diagnosis not present

## 2019-04-27 DIAGNOSIS — R0789 Other chest pain: Secondary | ICD-10-CM | POA: Diagnosis not present

## 2019-04-27 DIAGNOSIS — R7302 Impaired glucose tolerance (oral): Secondary | ICD-10-CM | POA: Diagnosis not present

## 2019-05-17 ENCOUNTER — Telehealth: Payer: Self-pay

## 2019-05-17 NOTE — Telephone Encounter (Signed)
Spoke with pt and obtained verbal consent for phone visit. Pt will have vitals ready for appt.    YOUR CARDIOLOGY TEAM HAS ARRANGED FOR AN E-VISIT FOR YOUR APPOINTMENT - PLEASE REVIEW IMPORTANT INFORMATION BELOW SEVERAL DAYS PRIOR TO YOUR APPOINTMENT  Due to the recent COVID-19 pandemic, we are transitioning in-person office visits to tele-medicine visits in an effort to decrease unnecessary exposure to our patients, their families, and staff. These visits are billed to your insurance just like a normal visit is. We also encourage you to sign up for MyChart if you have not already done so. You will need a smartphone if possible. For patients that do not have this, we can still complete the visit using a regular telephone but do prefer a smartphone to enable video when possible. You may have a family member that lives with you that can help. If possible, we also ask that you have a blood pressure cuff and scale at home to measure your blood pressure, heart rate and weight prior to your scheduled appointment. Patients with clinical needs that need an in-person evaluation and testing will still be able to come to the office if absolutely necessary. If you have any questions, feel free to call our office.     YOUR PROVIDER WILL BE USING THE FOLLOWING PLATFORM TO COMPLETE YOUR VISIT: [email or web address]  . IF USING MYCHART - How to Download the MyChart App to Your SmartPhone   - If Apple, go to [employer] and type in MyChart in the search bar and download the app. If Android, ask patient to go to [employer] and type in [place] in the search bar and download the app. The app is free but as with any other app downloads, your phone may require you to verify saved payment information or Apple/Android password.  - You will need to then log into the app with your MyChart username and password, and select [place] as your healthcare provider to link the account.  - When it is time for your visit, go to the MyChart  app, find appointments, and click Begin Video Visit. Be sure to Select Allow for your device to access the Microphone and Camera for your visit. You will then be connected, and your provider will be with you shortly.  **If you have any issues connecting or need assistance, please contact MyChart service desk [phone])**  **If using a computer, in order to ensure the best quality for your visit, you will need to use either of the following Internet Browsers: Insurance underwriter or [employer]**  . IF USING DOXIMITY or DOXY.ME - The staff will give you instructions on receiving your link to join the meeting the day of your visit.      2-3 DAYS BEFORE YOUR APPOINTMENT  You will receive a telephone call from one of our [place] team members - your caller ID may say "Unknown caller." If this is a video visit, we will walk you through how to get the video launched on your phone. We will remind you check your blood pressure, heart rate and weight prior to your scheduled appointment. If you have an Apple Watch or Kardia, please upload any pertinent ECG strips the day before or morning of your appointment to [facility]. Our staff will also make sure you have reviewed the consent and agree to move forward with your scheduled tele-health visit.     THE DAY OF YOUR APPOINTMENT  Approximately 15 minutes prior to your scheduled appointment,  appointment, you will receive a telephone call from one of [facility] team - your caller ID may say "Unknown caller."  Our staff will confirm medications, vital signs for the day and any symptoms you may be experiencing. Please have this information available prior to the time of visit start. It may also be helpful for you to have a pad of paper and pen handy for any instructions given during your visit. They will also walk you through joining the smartphone meeting if this is a video visit.    CONSENT FOR TELE-HEALTH VISIT - PLEASE REVIEW  I hereby voluntarily  request, consent and authorize [facility] and its employed or contracted physicians, physician assistants, nurse practitioners or other licensed health care professionals (the Practitioner), to provide me with telemedicine health care services (the "Services") as deemed necessary by the treating Practitioner. I acknowledge and consent to receive the Services by the Practitioner via telemedicine. I understand that the telemedicine visit will involve communicating with the Practitioner through live audiovisual communication technology and the disclosure of certain medical information by electronic transmission. I acknowledge that I have been given the opportunity to request an in-person assessment or other available alternative prior to the telemedicine visit and am voluntarily participating in the telemedicine visit.  I understand that I have the right to withhold or withdraw my consent to the use of telemedicine in the course of my care at any time, without affecting my right to future care or treatment, and that the Practitioner or I may terminate the telemedicine visit at any time. I understand that I have the right to inspect all information obtained and/or recorded in the course of the telemedicine visit and may receive copies of available information for a reasonable fee.  I understand that some of the potential risks of receiving the Services via telemedicine include:  . Delay or interruption in medical evaluation due to technological equipment failure or disruption; . Information transmitted may not be sufficient (e.g. poor resolution of images) to allow for appropriate medical decision making by the Practitioner; and/or  . In rare instances, security protocols could fail, causing a breach of personal health information.  Furthermore, I acknowledge that it is my responsibility to provide information about my medical history, conditions and care that is complete and accurate to the best of my  ability. I acknowledge that Practitioner's advice, recommendations, and/or decision may be based on factors not within their control, such as incomplete or inaccurate data provided by me or distortions of diagnostic images or specimens that may result from electronic transmissions. I understand that the practice of medicine is not an exact science and that Practitioner makes no warranties or guarantees regarding treatment outcomes. I acknowledge that I will receive a copy of this consent concurrently upon execution via email to the email address I last provided but may also request a printed copy by calling the office of [facility].    I understand that my insurance will be billed for this visit.   I have read or had this consent read to me. . I understand the contents of this consent, which adequately explains the benefits and risks of the Services being provided via telemedicine.  . I have been provided ample opportunity to ask questions regarding this consent and the Services and have had my questions answered to my satisfaction. . I give my informed consent for the services to be provided through the use of telemedicine in my medical care  By participating in this telemedicine   I agree to the above.

## 2019-05-18 ENCOUNTER — Other Ambulatory Visit: Payer: Self-pay

## 2019-05-18 ENCOUNTER — Telehealth (INDEPENDENT_AMBULATORY_CARE_PROVIDER_SITE_OTHER): Payer: 59 | Admitting: Cardiovascular Disease

## 2019-05-18 ENCOUNTER — Encounter: Payer: Self-pay | Admitting: Cardiovascular Disease

## 2019-05-18 VITALS — BP 151/82 | HR 49 | Ht 71.75 in | Wt 325.0 lb

## 2019-05-18 DIAGNOSIS — I10 Essential (primary) hypertension: Secondary | ICD-10-CM

## 2019-05-18 DIAGNOSIS — I2510 Atherosclerotic heart disease of native coronary artery without angina pectoris: Secondary | ICD-10-CM | POA: Diagnosis not present

## 2019-05-18 DIAGNOSIS — E782 Mixed hyperlipidemia: Secondary | ICD-10-CM

## 2019-05-18 MED ORDER — NITROGLYCERIN 0.4 MG SL SUBL
0.4000 mg | SUBLINGUAL_TABLET | SUBLINGUAL | 2 refills | Status: DC | PRN
Start: 2019-05-18 — End: 2024-07-25

## 2019-05-18 NOTE — Progress Notes (Signed)
Virtual Visit via Telephone Note   This visit type was conducted due to national recommendations for restrictions regarding the COVID-19 Pandemic (e.g. social distancing) in an effort to limit this patient's exposure and mitigate transmission in our community.  Due to his co-morbid illnesses, this patient is at least at moderate risk for complications without adequate follow up.  This format is felt to be most appropriate for this patient at this time.  The patient did not have access to video technology/had technical difficulties with video requiring transitioning to audio format only (telephone).  All issues noted in this document were discussed and addressed.  No physical exam could be performed with this format.  Please refer to the patient's chart for his  consent to telehealth for [facility].   Date:  05/18/2019   ID:  Francis Foster, DOB 1961/11/26, MRN [id number]  Patient Location: Home Provider Location: Home  PCP:  Burnard Bunting, MD  Cardiologist:  No primary care provider on file.  Electrophysiologist:  None   Evaluation Performed:  Follow-Up Visit  Chief Complaint:  Follow-up CAD  History of Present Illness:    Francis Foster is a 58 y.o. male with history of coronary artery disease with coronary stenting in 2017.  The patient presented with exertional angina and was found to have anterolateral and apical ischemia on stress testing.  He was found to have severe stenosis of the diagonal branch which was treated with a drug-eluting stent.  He took dual antiplatelet therapy with aspirin and clopidogrel for 12 months and has been maintained on long-term aspirin.  He has done well in follow-up with no symptoms of angina or congestive heart failure.  Today, he specifically denies chest pain, chest pressure, or exertional dyspnea.  He walks regularly for exercise with no exertional symptoms.  He is compliant with his medications.  The patient does not have symptoms concerning  for COVID-19 infection (fever, chills, cough, or new shortness of breath).    Past Medical History:  Diagnosis Date  . Abnormal nuclear stress test 12/28/2015  . Benign essential HTN   . CAD (coronary artery disease), native coronary artery 12/29/2015   S/p DES to ostial D1  . Chest discomfort   . Chest tightness   . Depression   . Dizziness   . IGT (impaired glucose tolerance)   . Lightheaded   . Morbid obesity ([place])   . Unstable angina [facility])    Past Surgical History:  Procedure Laterality Date  . CARDIAC CATHETERIZATION N/A 12/28/2015   Procedure: Left Heart Cath and Coronary Angiography;  Surgeon: Sherren Mocha, MD;  Location: [facility];  Service: Cardiovascular;  Laterality: N/A;  . HYPOSPADIAS CORRECTION     58 years old     Current Meds  Medication Sig  . aspirin 81 MG tablet Take 81 mg by mouth daily.  Marland Kitchen atorvastatin (LIPITOR) 80 MG tablet Take 1 tablet (80 mg total) by mouth daily at 6 PM.  . BYSTOLIC 10 MG tablet TAKE ONE TABLET EACH DAY  . lisinopril-hydrochlorothiazide (PRINZIDE,ZESTORETIC) 20-12.5 MG tablet Take 1 tablet by mouth daily.  . nitroGLYCERIN (NITROSTAT) 0.4 MG SL tablet Place 1 tablet (0.4 mg total) under the tongue every 5 (five) minutes as needed for chest pain.     Allergies:   Patient has no known allergies.   Social History   Tobacco Use  . Smoking status: Never Smoker  . Smokeless tobacco: Never Used  Substance Use Topics  . Alcohol use:  Yes    Alcohol/week: 0.0 standard drinks  . Drug use: No     Family Hx: The patient's family history includes Healthy in his sister; Neuropathy in his father; Osteoarthritis in his mother.  ROS:   Please see the history of present illness.    All other systems reviewed and are negative.  Labs/Other Tests and Data Reviewed:    EKG:  No ECG reviewed.  Recent Labs: No results found for requested labs within last 8760 hours.   Recent Lipid Panel No results found for: CHOL, TRIG, HDL, CHOLHDL,  LDLCALC, LDLDIRECT  Wt Readings from Last 3 Encounters:  05/18/19 (!) 325 lb (147.4 kg)  03/22/18 (!) 324 lb 1.9 oz (147 kg)  03/06/17 (!) 313 lb 9.6 oz (142.2 kg)     Objective:    Vital Signs:  BP (!) 151/82   Pulse (!) 49   Ht 5' 11.75" (1.822 m)   Wt (!) 325 lb (147.4 kg)   BMI 44.39 kg/m    VITAL SIGNS:  reviewed Remaining exam is deferred secondary to telephone visit type  ASSESSMENT & PLAN:    1. Coronary artery disease, native vessel, without angina: The patient is stable on a combination of low-dose aspirin, high intensity statin drug, and a beta-blocker.  I will see him back in 1 year for follow-up. 2. Mixed hyperlipidemia: Lipids are followed by Dr. Reynaldo Minium.  He is treated with atorvastatin 80 mg daily.  Lifestyle modification is discussed this morning. 3. Essential hypertension: Blood pressure is averaging 135 to 140 mmHg over 85 to 90 mmHg.  We discussed consideration of doubling lisinopril/hydrochlorothiazide versus a trial of weight loss.  The patient favors a nonpharmacologic strategy of weight loss.  He is motivated to lose weight as he is getting married in September of this year.  He will aim for about 25 pounds of weight loss to try to keep his weight under 300 pounds.  I asked him to continue to monitor his blood pressure and notify me or Dr. Reynaldo Minium if he is consistently seeing blood pressure readings greater than 140/90.  COVID-19 Education: The signs and symptoms of COVID-19 were discussed with the patient and how to seek care for testing (follow up with PCP or arrange E-visit). The importance of social distancing was discussed today.  Time:   Today, I have spent 14 minutes with the patient with telehealth technology discussing the above problems.     Medication Adjustments/Labs and Tests Ordered: Current medicines are reviewed at length with the patient today.  Concerns regarding medicines are outlined above.   Tests Ordered: No orders of the defined  types were placed in this encounter.   Medication Changes: No orders of the defined types were placed in this encounter.   Disposition:  Follow up in 1 year(s)  Signed, Sherren Mocha, MD  05/18/2019 9:51 AM    [facility]

## 2019-06-14 ENCOUNTER — Other Ambulatory Visit: Payer: Self-pay | Admitting: Cardiovascular Disease

## 2019-06-14 DIAGNOSIS — I2510 Atherosclerotic heart disease of native coronary artery without angina pectoris: Secondary | ICD-10-CM

## 2019-06-14 DIAGNOSIS — E782 Mixed hyperlipidemia: Secondary | ICD-10-CM

## 2019-07-13 ENCOUNTER — Ambulatory Visit: Payer: 59 | Admitting: Cardiovascular Disease

## 2019-09-21 ENCOUNTER — Other Ambulatory Visit: Payer: Self-pay | Admitting: Cardiovascular Disease

## 2019-09-21 DIAGNOSIS — I2510 Atherosclerotic heart disease of native coronary artery without angina pectoris: Secondary | ICD-10-CM

## 2019-09-21 DIAGNOSIS — E782 Mixed hyperlipidemia: Secondary | ICD-10-CM

## 2020-01-24 ENCOUNTER — Telehealth: Payer: Self-pay

## 2020-01-24 NOTE — Telephone Encounter (Signed)
I have attempted several times to do a Bystolic PA for the pt but have been unsuccessful as I continue to get the same message from covermymeds as follows:  Francis Foster Key: [id number] Status New  Patient authentication failed. The patient's zip code and/or phone number provided do not match our records. Please update the request and resubmit via ePA.  DrugBystolic 10MG  tablets  [employer] administrator PA Form  I called [facility] who confirmed the same zip code and phone number that we have on file for the pt. I have called and left a detailed message asking the pt to call me back.

## 2020-01-27 NOTE — Telephone Encounter (Addendum)
**Note De-Identified [name] Obfuscation** I have attempted another Bystlic PA through covermymeds per request from [place] and received the following message again: Francis Foster Key: [id number] - Rx #: [id number] Status Patient authentication failed. The patient's zip code and/or phone number provided do not match our records. Please update the request and resubmit [name] ePA. Drug Bystolic 10MG  tablets Form [employer] Electronic PA Form [phone] NCPDP)  I called the only phone# we have listed for the pt in his chart but got no answer and could not leave a message as the pts VM is full.

## 2020-04-12 ENCOUNTER — Telehealth: Payer: Self-pay | Admitting: Cardiovascular Disease

## 2020-04-12 DIAGNOSIS — E782 Mixed hyperlipidemia: Secondary | ICD-10-CM

## 2020-04-12 DIAGNOSIS — I2510 Atherosclerotic heart disease of native coronary artery without angina pectoris: Secondary | ICD-10-CM

## 2020-04-12 NOTE — Telephone Encounter (Signed)
[employer] called. The patient tried to renew his Bystolic [id number] but it was denied. The pharmacy said the patient needs a new prior authorization The Prior Auth can be submitted on [email or web address] or call the number provided

## 2020-04-13 ENCOUNTER — Other Ambulatory Visit: Payer: Self-pay

## 2020-04-13 DIAGNOSIS — I2510 Atherosclerotic heart disease of native coronary artery without angina pectoris: Secondary | ICD-10-CM

## 2020-04-13 DIAGNOSIS — E782 Mixed hyperlipidemia: Secondary | ICD-10-CM

## 2020-04-13 MED ORDER — NEBIVOLOL HCL 10 MG PO TABS: 10.0000 mg | ORAL_TABLET | Freq: Every day | ORAL | 0 refills | Status: DC

## 2020-04-13 NOTE — Telephone Encounter (Addendum)
**Note De-Identified Eleri Ruben Obfuscation** I have been calling this pt since 01/24/20 to get the information I need to do this PA. We have no updated ins info on the pt as we have [employer] as his ins provider but [employer] called. I called the pts pharmacy [place] November and was given the following info: [employer] ID: [id number] [place]: [id number] PCN: [id number] [id number]  I attempted this Bystolic PA through covermymeds using new ins info but received a message stating the following: Lj Fennewald Key: [id number] Need help? Call us at [phone]  Status  New(Not sent to plan)  Patient authentication failed. The patient's zip code and/or phone number provided do not match our records. Please update the request and resubmit Voris Tigert ePA.  DrugBystolic 10MG  tablets  Museum/gallery curator PA Form [phone] NCPDP)  I have called and left a message on the pts VM asking him to call us to provide the address and phone number associated with his [employer] card.

## 2020-04-13 NOTE — Telephone Encounter (Signed)
**Note De-Identified Ginnifer Creelman Obfuscation** The pt called and gave me the info needed to do this Bystolic PA as follows: Address associated with his [employer] card which is in his wifes name: [address] [place] [address] Phone: [phone] (same as on file).  I was able to do the PA through covermymeds and received the following message: Tao Ogonowski Key: [facility] - PA Case ID: [id number] Outcome  Approved today  Prior Auth;Coverage Start Date:04/13/2020;Coverage End [id number] Drug Bystolic 10MG  tablets  Museum/gallery curator PA Form [phone] NCPDP)  I called the pt back and made him aware of this Bystolic PA and to ask which pharmacy to send approval to. He [facility] me [facility] [address] in [place], [place] [address]. I checked and we have not send a refill in to that pharmacy in the past so [facility] as way of letting them know this PA is approved.

## 2020-04-13 NOTE — Addendum Note (Signed)
**Note De-Identified Francis Foster Obfuscation** Addended by: Dennie Fetters on: 04/13/2020 12:13 PM   Modules accepted: Orders

## 2020-04-13 NOTE — Telephone Encounter (Signed)
Patient states he was told by the insurance company to call about getting prior authorization for the medication.

## 2020-04-13 NOTE — Telephone Encounter (Signed)
Pt's medication was sent to pt's pharmacy as requested. Confirmation received.  °

## 2020-08-02 NOTE — Progress Notes (Signed)
Cardiology Office Note:    Date:  08/03/2020   ID:  Francis Foster, DOB [birth year], MRN [id number]  PCP:  Burnard Bunting, MD  Cardiologist:  Sherren Mocha, MD  Electrophysiologist:  None   Referring MD: Burnard Bunting, MD   Chief Complaint:  Follow-up (CAD)    Patient Profile:    Francis Foster is a 59 y.o. male with:   Coronary artery disease   S/p DES to Dx in 2017  Hypertension   Hyperlipidemia  Morbid obesity    Prior CV studies: Cardiac catheterization 01-21-2016 LAD irreg; D1 95 LCx patent RCA patent  PCI: 2.5 x 12 mm Promus DES to D1  Myoview 12/27/15 EF 51, ant-lat/apical ischemia, Intermediate Risk   History of Present Illness:    Francis Foster was last seen by Dr. Burt Knack via Telemedicine in 04/2019.  He returns for follow up.  He is here alone today.  He has moved to [place], [place].  He continues to follow-up with his providers here.  He has not had chest discomfort, significant shortness of breath, syncope, orthopnea, leg swelling.  He has noted that his feet are cold at times.  He has not had claudication symptoms.     Past Medical History:  Diagnosis Date  . Abnormal nuclear stress test 01-21-16  . Benign essential HTN   . CAD (coronary artery disease), native coronary artery 12/29/2015   S/p DES to ostial D1  . Chest discomfort   . Chest tightness   . Depression   . Dizziness   . IGT (impaired glucose tolerance)   . Lightheaded   . Morbid obesity ([place])   . Unstable angina ([facility])     Current Medications: Current Meds  Medication Sig  . aspirin 81 MG tablet Take 81 mg by mouth daily.  Marland Kitchen atorvastatin (LIPITOR) 80 MG tablet Take 1 tablet (80 mg total) by mouth daily at 6 PM.  . lisinopril-hydrochlorothiazide (PRINZIDE,ZESTORETIC) 20-12.5 MG tablet Take 1 tablet by mouth daily.  . nebivolol (BYSTOLIC) 10 MG tablet Take 1 tablet (10 mg total) by mouth daily. Please make yearly appt with Dr. Burt Knack for May before anymore refills. 1st  attempt  . nitroGLYCERIN (NITROSTAT) 0.4 MG SL tablet Place 1 tablet (0.4 mg total) under the tongue every 5 (five) minutes as needed for chest pain.     Allergies:   Patient has no known allergies.   Social History   Tobacco Use  . Smoking status: Never Smoker  . Smokeless tobacco: Never Used  Vaping Use  . Vaping Use: Never used  Substance Use Topics  . Alcohol use: Yes    Alcohol/week: 0.0 standard drinks  . Drug use: No     Family Hx: The patient's family history includes Healthy in his sister; Neuropathy in his father; Osteoarthritis in his mother.  ROS See HPI  EKGs/Labs/Other Test Reviewed:    EKG:  EKG is   ordered today.  The ekg ordered today demonstrates sinus bradycardia, HR 58, normal axis, no ST-T wave changes, QTC 414, no change from prior tracing  Recent Labs: Labs from PCP personally reviewed and interpreted: Total cholesterol 112, HDL 38, LDL 48, triglycerides 129, hemoglobin 12.8, creatinine 1.0, ALT 60, TSH 1.41  Physical Exam:    VS:  BP (!) 142/70   Pulse (!) 58   Ht 5\' 11"  (1.803 m)   Wt (!) 324 lb (147 kg)   SpO2 98%   BMI 45.19 kg/m     Wt Readings from  Last 3 Encounters:  08/03/20 (!) 324 lb (147 kg)  05/18/19 (!) 325 lb (147.4 kg)  03/22/18 (!) 324 lb 1.9 oz (147 kg)     Constitutional:      Appearance: Healthy appearance. Not in distress.  Neck:     Vascular: JVD normal.  Pulmonary:     Effort: Pulmonary effort is normal.     Breath sounds: No wheezing. No rales.  Cardiovascular:     Normal rate. Regular rhythm. Normal S1. Normal S2.     Murmurs: There is no murmur.  Pulses:    Intact distal pulses.     Comments: Good capillary refill Edema:    Peripheral edema absent.  Abdominal:     Palpations: Abdomen is soft. There is no hepatomegaly.  Skin:    General: Skin is warm and dry.  Neurological:     General: No focal deficit present.     Mental Status: Alert and oriented to person, place and time.     Cranial Nerves:  Cranial nerves are intact.      ASSESSMENT & PLAN:    1. Coronary artery disease involving native coronary artery of native heart without angina pectoris History of DES to the diagonal in 2017.  He is doing well without anginal symptoms.  Continue aspirin, atorvastatin.  Follow-up in 1 year.  2. Essential hypertension Blood pressure above target.  He notes his blood pressure typically runs higher in the clinic.  We discussed the importance of limiting dietary salt as well as regular activity and weight loss.  I have asked him to check his blood pressure over the next couple weeks and send me those readings for review.  If his blood pressure remains above target, we can increase his lisinopril/HCTZ.  I will provide him with a copy of the DASH diet.  3. Mixed hyperlipidemia LDL optimal on most recent lab work.  Continue current Rx.    4. Cold feet He has no claudication symptoms.  His distal pulses are intact.  Capillary refill is good.  I suspect that this is related to a combination of blood pressure medications and aspirin.    Dispo:  Return in about 1 year (around 08/03/2021) for Routine Follow Up, w/ Dr. Burt Knack, or Richardson Dopp, PA-C, in person.   Medication Adjustments/Labs and Tests Ordered: Current medicines are reviewed at length with the patient today.  Concerns regarding medicines are outlined above.  Tests Ordered: Orders Placed This Encounter  Procedures  . EKG 12-Lead   Medication Changes: No orders of the defined types were placed in this encounter.   Signed, Richardson Dopp, PA-C  08/03/2020 8:49 AM    [facility] [address], [place], [place]  [address] Phone: [phone]; Fax: [phone]

## 2020-08-03 ENCOUNTER — Other Ambulatory Visit: Payer: Self-pay

## 2020-08-03 ENCOUNTER — Encounter: Payer: Self-pay | Admitting: Physician Assistant

## 2020-08-03 ENCOUNTER — Ambulatory Visit (INDEPENDENT_AMBULATORY_CARE_PROVIDER_SITE_OTHER): Payer: Managed Care, Other (non HMO) | Admitting: Physician Assistant

## 2020-08-03 VITALS — BP 142/70 | HR 58 | Ht 71.0 in | Wt 324.0 lb

## 2020-08-03 DIAGNOSIS — I2510 Atherosclerotic heart disease of native coronary artery without angina pectoris: Secondary | ICD-10-CM

## 2020-08-03 DIAGNOSIS — E782 Mixed hyperlipidemia: Secondary | ICD-10-CM

## 2020-08-03 DIAGNOSIS — R209 Unspecified disturbances of skin sensation: Secondary | ICD-10-CM | POA: Diagnosis not present

## 2020-08-03 DIAGNOSIS — I10 Essential (primary) hypertension: Secondary | ICD-10-CM | POA: Diagnosis not present

## 2020-08-03 NOTE — Patient Instructions (Signed)
Medication Instructions:  Your physician recommends that you continue on your current medications as directed. Please refer to the Current Medication list given to you today.  *If you need a refill on your cardiac medications before your next appointment, please call your pharmacy*  Lab Work: None ordered today  Testing/Procedures: None ordered today  Follow-Up: At [facility], you and your health needs are our priority.  As part of our continuing mission to provide you with exceptional heart care, we have created designated Provider Care Teams.  These Care Teams include your primary Cardiologist (physician) and Advanced Practice Providers (APPs -  Physician Assistants and Nurse Practitioners) who all work together to provide you with the care you need, when you need it.  We recommend signing up for the patient portal called "MyChart".  Sign up information is provided on this After Visit Summary.  MyChart is used to connect with patients for Virtual Visits (Telemedicine).  Patients are able to view lab/test results, encounter notes, upcoming appointments, etc.  Non-urgent messages can be sent to your provider as well.   To learn more about what you can do with MyChart, go to [email or web address].    Your next appointment:   12 month(s)  The format for your next appointment:   In Person  Provider:   Sherren Mocha, MD  Other Instructions Check blood pressure once a day for 2 weeks and call with readings.   DASH Eating Plan DASH stands for "Dietary Approaches to Stop Hypertension." The DASH eating plan is a healthy eating plan that has been shown to reduce high blood pressure (hypertension). It may also reduce your risk for type 2 diabetes, heart disease, and stroke. The DASH eating plan may also help with weight loss. What are tips for following this plan?  General guidelines  Avoid eating more than 2,300 mg (milligrams) of salt (sodium) a day. If you have hypertension, you  may need to reduce your sodium intake to 1,500 mg a day.  Limit alcohol intake to no more than 1 drink a day for nonpregnant women and 2 drinks a day for men. One drink equals 12 oz of beer, 5 oz of wine, or 1 oz of hard liquor.  Work with your health care provider to maintain a healthy body weight or to lose weight. Ask what an ideal weight is for you.  Get at least 30 minutes of exercise that causes your heart to beat faster (aerobic exercise) most days of the week. Activities may include walking, swimming, or biking.  Work with your health care provider or diet and nutrition specialist (dietitian) to adjust your eating plan to your individual calorie needs. Reading food labels   Check food labels for the amount of sodium per serving. Choose foods with less than 5 percent of the Daily Value of sodium. Generally, foods with less than 300 mg of sodium per serving fit into this eating plan.  To find whole grains, look for the word "whole" as the first word in the ingredient list. Shopping  Buy products labeled as "low-sodium" or "no salt added."  Buy fresh foods. Avoid canned foods and premade or frozen meals. Cooking  Avoid adding salt when cooking. Use salt-free seasonings or herbs instead of table salt or sea salt. Check with your health care provider or pharmacist before using salt substitutes.  Do not fry foods. Cook foods using healthy methods such as baking, boiling, grilling, and broiling instead.  Cook with heart-healthy oils, such as olive, canola, soybean,  or sunflower oil. Meal planning  Eat a balanced diet that includes: ? 5 or more servings of fruits and vegetables each day. At each meal, try to fill half of your plate with fruits and vegetables. ? Up to 6-8 servings of whole grains each day. ? Less than 6 oz of lean meat, poultry, or fish each day. A 3-oz serving of meat is about the same size as a deck of cards. One egg equals 1 oz. ? 2 servings of low-fat dairy each  day. ? A serving of nuts, seeds, or beans 5 times each week. ? Heart-healthy fats. Healthy fats called Omega-3 fatty acids are found in foods such as flaxseeds and coldwater fish, like sardines, salmon, and mackerel.  Limit how much you eat of the following: ? Canned or prepackaged foods. ? Food that is high in trans fat, such as fried foods. ? Food that is high in saturated fat, such as fatty meat. ? Sweets, desserts, sugary drinks, and other foods with added sugar. ? Full-fat dairy products.  Do not salt foods before eating.  Try to eat at least 2 vegetarian meals each week.  Eat more home-cooked food and less restaurant, buffet, and fast food.  When eating at a restaurant, ask that your food be prepared with less salt or no salt, if possible. What foods are recommended? The items listed may not be a complete list. Talk with your dietitian about what dietary choices are best for you. Grains Whole-grain or whole-wheat bread. Whole-grain or whole-wheat pasta. Brown rice. [place]. Bulgur. Whole-grain and low-sodium cereals. Pita bread. Low-fat, low-sodium crackers. Whole-wheat flour tortillas. Vegetables Fresh or frozen vegetables (raw, steamed, roasted, or grilled). Low-sodium or reduced-sodium tomato and vegetable juice. Low-sodium or reduced-sodium tomato sauce and tomato paste. Low-sodium or reduced-sodium canned vegetables. Fruits All fresh, dried, or frozen fruit. Canned fruit in natural juice (without added sugar). Meat and other protein foods Skinless chicken or [place]. Ground chicken or [place]. Pork with fat trimmed off. Fish and seafood. Egg whites. Dried beans, peas, or lentils. Unsalted nuts, nut butters, and seeds. Unsalted canned beans. Lean cuts of beef with fat trimmed off. Low-sodium, lean deli meat. Dairy Low-fat (1%) or fat-free (skim) milk. Fat-free, low-fat, or reduced-fat cheeses. Nonfat, low-sodium ricotta or cottage cheese. Low-fat or nonfat yogurt.  Low-fat, low-sodium cheese. Fats and oils Soft margarine without trans fats. Vegetable oil. Low-fat, reduced-fat, or light mayonnaise and salad dressings (reduced-sodium). Canola, safflower, olive, soybean, and sunflower oils. Avocado. Seasoning and other foods Herbs. Spices. Seasoning mixes without salt. Unsalted popcorn and pretzels. Fat-free sweets. What foods are not recommended? The items listed may not be a complete list. Talk with your dietitian about what dietary choices are best for you. Grains Baked goods made with fat, such as croissants, muffins, or some breads. Dry pasta or rice meal packs. Vegetables Creamed or fried vegetables. Vegetables in a cheese sauce. Regular canned vegetables (not low-sodium or reduced-sodium). Regular canned tomato sauce and paste (not low-sodium or reduced-sodium). Regular tomato and vegetable juice (not low-sodium or reduced-sodium). Angie Fava. Olives. Fruits Canned fruit in a light or heavy syrup. Fried fruit. Fruit in cream or butter sauce. Meat and other protein foods Fatty cuts of meat. Ribs. Fried meat. Berniece Salines. Sausage. Bologna and other processed lunch meats. Salami. Fatback. Hotdogs. Bratwurst. Salted nuts and seeds. Canned beans with added salt. Canned or smoked fish. Whole eggs or egg yolks. Chicken or [place] with skin. Dairy Whole or 2% milk, cream, and half-and-half. Whole or full-fat  cream cheese. Whole-fat or sweetened yogurt. Full-fat cheese. Nondairy creamers. Whipped toppings. Processed cheese and cheese spreads. Fats and oils Butter. Stick margarine. Lard. Shortening. Ghee. Bacon fat. Tropical oils, such as coconut, palm kernel, or palm oil. Seasoning and other foods Salted popcorn and pretzels. Onion salt, garlic salt, seasoned salt, table salt, and sea salt. Worcestershire sauce. Tartar sauce. Barbecue sauce. Teriyaki sauce. Soy sauce, including reduced-sodium. Steak sauce. Canned and packaged gravies. Fish sauce. Oyster sauce. Cocktail  sauce. Horseradish that you find on the shelf. Ketchup. Mustard. Meat flavorings and tenderizers. Bouillon cubes. Hot sauce and Tabasco sauce. Premade or packaged marinades. Premade or packaged taco seasonings. Relishes. Regular salad dressings. Where to find more information:  National Heart, Lung, and [place]: https://wilson-eaton.com/  American Heart Association: www.heart.org Summary  The DASH eating plan is a healthy eating plan that has been shown to reduce high blood pressure (hypertension). It may also reduce your risk for type 2 diabetes, heart disease, and stroke.  With the DASH eating plan, you should limit salt (sodium) intake to 2,300 mg a day. If you have hypertension, you may need to reduce your sodium intake to 1,500 mg a day.  When on the DASH eating plan, aim to eat more fresh fruits and vegetables, whole grains, lean proteins, low-fat dairy, and heart-healthy fats.  Work with your health care provider or diet and nutrition specialist (dietitian) to adjust your eating plan to your individual calorie needs. This information is not intended to replace advice given to you by your health care provider. Make sure you discuss any questions you have with your health care provider. Document Revised: 11/20/2017 Document Reviewed: 12/01/2016 Elsevier Patient Education  2020 [employer].

## 2020-10-12 ENCOUNTER — Other Ambulatory Visit: Payer: Self-pay | Admitting: Cardiovascular Disease

## 2020-10-12 DIAGNOSIS — I2510 Atherosclerotic heart disease of native coronary artery without angina pectoris: Secondary | ICD-10-CM

## 2020-10-12 DIAGNOSIS — E782 Mixed hyperlipidemia: Secondary | ICD-10-CM

## 2021-07-15 ENCOUNTER — Other Ambulatory Visit: Payer: Self-pay | Admitting: Cardiovascular Disease

## 2021-07-15 DIAGNOSIS — E782 Mixed hyperlipidemia: Secondary | ICD-10-CM

## 2021-07-15 DIAGNOSIS — I2510 Atherosclerotic heart disease of native coronary artery without angina pectoris: Secondary | ICD-10-CM

## 2021-09-30 ENCOUNTER — Ambulatory Visit (INDEPENDENT_AMBULATORY_CARE_PROVIDER_SITE_OTHER): Payer: Managed Care, Other (non HMO) | Admitting: Cardiovascular Disease

## 2021-09-30 ENCOUNTER — Encounter: Payer: Self-pay | Admitting: Cardiovascular Disease

## 2021-09-30 ENCOUNTER — Other Ambulatory Visit: Payer: Self-pay

## 2021-09-30 VITALS — BP 160/92 | HR 56 | Ht 72.0 in | Wt 323.8 lb

## 2021-09-30 DIAGNOSIS — I10 Essential (primary) hypertension: Secondary | ICD-10-CM | POA: Diagnosis not present

## 2021-09-30 DIAGNOSIS — Z79899 Other long term (current) drug therapy: Secondary | ICD-10-CM

## 2021-09-30 DIAGNOSIS — E782 Mixed hyperlipidemia: Secondary | ICD-10-CM

## 2021-09-30 DIAGNOSIS — I2510 Atherosclerotic heart disease of native coronary artery without angina pectoris: Secondary | ICD-10-CM | POA: Diagnosis not present

## 2021-09-30 MED ORDER — LISINOPRIL-HYDROCHLOROTHIAZIDE 20-12.5 MG PO TABS: 2.0000 | ORAL_TABLET | Freq: Every day | ORAL | 3 refills | Status: DC

## 2021-09-30 NOTE — Progress Notes (Signed)
Cardiology Office Note:    Date:  09/30/2021   ID:  ANAY RATHE, DOB 01/16/1961, MRN [id number]  PCP:  Burnard Bunting, MD   [facility] Providers Cardiologist:  Sherren Mocha, MD     Referring MD: Burnard Bunting, MD   Chief Complaint  Patient presents with   Coronary Artery Disease    History of Present Illness:    Francis Foster is a 60 y.o. male with a hx of: Coronary artery disease  S/p DES to Dx in 2017 Hypertension  Hyperlipidemia Morbid obesity  The patient is here alone today.  He reports no specific cardiovascular-related complaints.  He denies chest pain, chest pressure, or shortness of breath.  He has not been doing as much walking as in the past.  He denies any exertional symptoms.  He brings in home blood pressures that are above goal.  His weight is up, but stable over the last 3 years based on my readings.  He had gotten his weight under 300 pounds early this past summer.  He attributes lack of exercise and dietary indiscretion to his weight gain now.  He feels ready to refocus and comes in today for follow-up evaluation.  Past Medical History:  Diagnosis Date   Abnormal nuclear stress test 12/28/2015   Benign essential HTN    CAD (coronary artery disease), native coronary artery 12/29/2015   S/p DES to ostial D1   Chest discomfort    Chest tightness    Depression    Dizziness    IGT (impaired glucose tolerance)    Lightheaded    Morbid obesity ([place])    Unstable angina [facility])     Past Surgical History:  Procedure Laterality Date   CARDIAC CATHETERIZATION N/A 12/28/2015   Procedure: Left Heart Cath and Coronary Angiography;  Surgeon: Sherren Mocha, MD;  Location: [place] [facility];  Service: Cardiovascular;  Laterality: N/A;   HYPOSPADIAS CORRECTION     60 years old    Current Medications: Current Meds  Medication Sig   aspirin 81 MG tablet Take 81 mg by mouth daily.   atorvastatin (LIPITOR) 80 MG tablet Take 1 tablet (80 mg total) by mouth  daily at 6 PM.   indomethacin (INDOCIN) 50 MG capsule Take 50 mg by mouth daily.   lisinopril-hydrochlorothiazide (ZESTORETIC) 20-12.5 MG tablet Take 2 tablets by mouth daily.   nebivolol (BYSTOLIC) 10 MG tablet TAKE 1 TABLET BY MOUTH EVERY DAY   nitroGLYCERIN (NITROSTAT) 0.4 MG SL tablet Place 1 tablet (0.4 mg total) under the tongue every 5 (five) minutes as needed for chest pain.   [DISCONTINUED] lisinopril-hydrochlorothiazide (PRINZIDE,ZESTORETIC) 20-12.5 MG tablet Take 1 tablet by mouth daily.     Allergies:   Patient has no known allergies.   Social History   Socioeconomic History   Marital status: Single    Spouse name: Not on file   Number of children: Not on file   Years of education: Not on file   Highest education level: Not on file  Occupational History   Not on file  Tobacco Use   Smoking status: Never   Smokeless tobacco: Never  Vaping Use   Vaping Use: Never used  Substance and Sexual Activity   Alcohol use: Yes    Alcohol/week: 0.0 standard drinks   Drug use: No   Sexual activity: Yes  Other Topics Concern   Not on file  Social History Narrative   Works in [employer]. Single. 3 children. Grew up in [place],  has lived in [place] since [place] Strain: Not on file  Food Insecurity: Not on file  Transportation Needs: Not on file  Physical Activity: Not on file  Stress: Not on file  Social Connections: Not on file     Family History: The patient's family history includes Healthy in his sister; Neuropathy in his father; Osteoarthritis in his mother.  ROS:   Please see the history of present illness.    All other systems reviewed and are negative.  EKGs/Labs/Other Studies Reviewed:    The following studies were reviewed today: Cardiac Cath 12/28/2015: Conclusion  1. Severe diagonal stenosis with successful PCI using a DES platform. 2. No significant stenosis in the RCA, LCx, or LAD proper    Recommend: ASA, Plavix x 12 months, aggressive risk reduction.  Diagnostic Dominance: Right Left Anterior Descending  Mild diffuse irregularity in the LAD is present. There is no significant stenosis throughout the LAD, but there is a critical lesion in the first diagonal branch.  First Environmental consultant.  Left Circumflex  The left circumflex is widely patent with no stenosis  Right Coronary Artery  The right coronary artery is a large, dominant vessel with no stenosis.  Intervention  Ost 1st Diag to 1st Diag lesion  PCI  The pre-interventional distal flow is normal (TIMI 3). Pre-stent angioplasty was performed. An unspecified stent was placed. Post-stent angioplasty was performed. Maximum pressure: 14 atm. The post-interventional distal flow is normal (TIMI 3). The intervention was successful. No complications occurred at this lesion. There is severe stenosis in the first diagonal branch. Heparin is used for anticoagulation. The patient was loaded with Plavix 600 mg. A cougar wire was advanced across the lesion without difficulty through an XB 3.5 cm guide catheter. The lesion was predilated with a 2.0 mm balloon, stented with a 2.5 x 12 mm Promus DES, and postdilated with a 2.5 mm noncompliant balloon to 14 atm.  There is a 0% residual stenosis post intervention.   Coronary Diagrams  Diagnostic Dominance: Right Intervention   EKG:  EKG is ordered today.  The ekg ordered today demonstrates sinus bradycardia 56 bpm, otherwise within normal limits.  Recent Labs: No results found for requested labs within last 8760 hours.  Recent Lipid Panel No results found for: CHOL, TRIG, HDL, CHOLHDL, VLDL, LDLCALC, LDLDIRECT   Risk Assessment/Calculations:           Physical Exam:    VS:  BP (!) 160/92   Pulse (!) 56   Ht 6' (1.829 m)   Wt (!) 323 lb 12.8 oz (146.9 kg)   SpO2 97%   BMI 43.92 kg/m     Wt Readings from Last 3 Encounters:  09/30/21 (!) 323 lb 12.8 oz (146.9  kg)  08/03/20 (!) 324 lb (147 kg)  05/18/19 (!) 325 lb (147.4 kg)     GEN: Well nourished, well developed, obese male, in no acute distress HEENT: Normal NECK: No JVD; No carotid bruits LYMPHATICS: No lymphadenopathy CARDIAC: RRR, no murmurs, rubs, gallops RESPIRATORY:  Clear to auscultation without rales, wheezing or rhonchi  ABDOMEN: Soft, non-tender, non-distended MUSCULOSKELETAL:  No edema; No deformity  SKIN: Warm and dry NEUROLOGIC:  Alert and oriented x 3 PSYCHIATRIC:  Normal affect   ASSESSMENT:    1. Coronary artery disease involving native coronary artery of native heart without angina pectoris   2. Essential hypertension   3. Medication management   4. Mixed hyperlipidemia  5. Morbid obesity ([place])    PLAN:    In order of problems listed above:  Stable without symptoms of angina.  He was having clear angina with physical activity prior to PCI in 2017.  This has not recurred since he underwent diagonal branch stenting.  He remains on aspirin and atorvastatin. Blood pressure is suboptimally controlled.  He brings in home readings consistently greater than 140/85.  Many of his systolic readings are greater than 160 mmHg.  I recommended doubling his lisinopril/hydrochlorothiazide to 40/25 mg daily.  Recommend a metabolic panel in about 4 weeks to make sure that his electrolytes are okay.  We discussed lifestyle modification extensively.  We discussed the potential impact of thiazide diuretic on his gout and may need to consider discontinuing this down the road if he has recurrent problems. As above Treated with a high intensity statin drug.  Last lipids showed a cholesterol of 114, HDL 40, LDL 21, triglycerides 265.  Lifestyle modification discussed. Counseling done regarding diet, exercise, and weight loss.    Medication Adjustments/Labs and Tests Ordered: Current medicines are reviewed at length with the patient today.  Concerns regarding medicines are outlined above.   Orders Placed This Encounter  Procedures   Basic Metabolic Panel (BMET)   EKG 12-Lead    Meds ordered this encounter  Medications   lisinopril-hydrochlorothiazide (ZESTORETIC) 20-12.5 MG tablet    Sig: Take 2 tablets by mouth daily.    Dispense:  180 tablet    Refill:  3     Patient Instructions  Medication Instructions:  Your physician has recommended you make the following change in your medication: increase your Lisinopril/ HCTZ to 10/25 mg daily   *If you need a refill on your cardiac medications before your next appointment, please call your pharmacy*   Lab Work: BMET in one month will give the order to have done in [place] If you have labs (blood work) drawn today and your tests are completely normal, you will receive your results only by: [place] (if you have MyChart) OR A paper copy in the mail If you have any lab test that is abnormal or we need to change your treatment, we will call you to review the results.   Testing/Procedures: none   Follow-Up: At [facility], you and your health needs are our priority.  As part of our continuing mission to provide you with exceptional heart care, we have created designated Provider Care Teams.  These Care Teams include your primary Cardiologist (physician) and Advanced Practice Providers (APPs -  Physician Assistants and Nurse Practitioners) who all work together to provide you with the care you need, when you need it.  We recommend signing up for the patient portal called "MyChart".  Sign up information is provided on this After Visit Summary.  MyChart is used to connect with patients for Virtual Visits (Telemedicine).  Patients are able to view lab/test results, encounter notes, upcoming appointments, etc.  Non-urgent messages can be sent to your provider as well.   To learn more about what you can do with MyChart, go to [email or web address].    Your next appointment:   1 year(s)  The format for your next  appointment:   In Person  Provider:   Sherren Mocha, MD   Other Instructions     Signed, Sherren Mocha, MD  09/30/2021 10:05 AM    [place]

## 2021-09-30 NOTE — Patient Instructions (Addendum)
Medication Instructions:  Your physician has recommended you make the following change in your medication: increase your Lisinopril/ HCTZ to 40/25 mg daily   *If you need a refill on your cardiac medications before your next appointment, please call your pharmacy*   Lab Work: BMET in one month will give the order to have done in [place] If you have labs (blood work) drawn today and your tests are completely normal, you will receive your results only by: [place] (if you have MyChart) OR A paper copy in the mail If you have any lab test that is abnormal or we need to change your treatment, we will call you to review the results.   Testing/Procedures: none   Follow-Up: At [facility], you and your health needs are our priority.  As part of our continuing mission to provide you with exceptional heart care, we have created designated Provider Care Teams.  These Care Teams include your primary Cardiologist (physician) and Advanced Practice Providers (APPs -  Physician Assistants and Nurse Practitioners) who all work together to provide you with the care you need, when you need it.  We recommend signing up for the patient portal called "MyChart".  Sign up information is provided on this After Visit Summary.  MyChart is used to connect with patients for Virtual Visits (Telemedicine).  Patients are able to view lab/test results, encounter notes, upcoming appointments, etc.  Non-urgent messages can be sent to your provider as well.   To learn more about what you can do with MyChart, go to [email or web address].    Your next appointment:   1 year(s)  The format for your next appointment:   In Person  Provider:   Sherren Mocha, MD   Other Instructions

## 2022-07-03 ENCOUNTER — Other Ambulatory Visit: Payer: Self-pay | Admitting: Cardiovascular Disease

## 2022-07-03 DIAGNOSIS — I2510 Atherosclerotic heart disease of native coronary artery without angina pectoris: Secondary | ICD-10-CM

## 2022-07-03 DIAGNOSIS — E782 Mixed hyperlipidemia: Secondary | ICD-10-CM

## 2022-09-29 ENCOUNTER — Other Ambulatory Visit: Payer: Self-pay | Admitting: Cardiovascular Disease

## 2022-09-29 DIAGNOSIS — E782 Mixed hyperlipidemia: Secondary | ICD-10-CM

## 2022-09-29 DIAGNOSIS — I2510 Atherosclerotic heart disease of native coronary artery without angina pectoris: Secondary | ICD-10-CM

## 2022-10-23 ENCOUNTER — Other Ambulatory Visit: Payer: Self-pay | Admitting: Cardiovascular Disease

## 2022-11-18 ENCOUNTER — Ambulatory Visit: Payer: Managed Care, Other (non HMO) | Attending: Physician Assistant | Admitting: Physician Assistant

## 2022-11-18 ENCOUNTER — Encounter: Payer: Self-pay | Admitting: Physician Assistant

## 2022-11-18 VITALS — BP 140/70 | HR 51 | Ht 72.0 in | Wt 340.3 lb

## 2022-11-18 DIAGNOSIS — I10 Essential (primary) hypertension: Secondary | ICD-10-CM | POA: Diagnosis not present

## 2022-11-18 DIAGNOSIS — E782 Mixed hyperlipidemia: Secondary | ICD-10-CM

## 2022-11-18 DIAGNOSIS — I2510 Atherosclerotic heart disease of native coronary artery without angina pectoris: Secondary | ICD-10-CM

## 2022-11-18 DIAGNOSIS — Z79899 Other long term (current) drug therapy: Secondary | ICD-10-CM

## 2022-11-18 NOTE — Patient Instructions (Signed)
Medication Instructions:  Your physician recommends that you continue on your current medications as directed. Please refer to the Current Medication list given to you today.  *If you need a refill on your cardiac medications before your next appointment, please call your pharmacy*   Lab Work: None If you have labs (blood work) drawn today and your tests are completely normal, you will receive your results only by: [place] (if you have MyChart) OR A paper copy in the mail If you have any lab test that is abnormal or we need to change your treatment, we will call you to review the results.   Follow-Up: At [facility], you and your health needs are our priority.  As part of our continuing mission to provide you with exceptional heart care, we have created designated Provider Care Teams.  These Care Teams include your primary Cardiologist (physician) and Advanced Practice Providers (APPs -  Physician Assistants and Nurse Practitioners) who all work together to provide you with the care you need, when you need it.  We recommend signing up for the patient portal called "MyChart".  Sign up information is provided on this After Visit Summary.  MyChart is used to connect with patients for Virtual Visits (Telemedicine).  Patients are able to view lab/test results, encounter notes, upcoming appointments, etc.  Non-urgent messages can be sent to your provider as well.   To learn more about what you can do with MyChart, go to [email or web address].    Your next appointment:   6 month(s)  The format for your next appointment:   In Person  Provider:   Sherren Mocha, MD    Other Instructions Check your blood pressure daily, one hour after taking your morning medications, keep a log and bring it with you to your next visit.  Important Information About Sugar

## 2022-11-18 NOTE — Progress Notes (Signed)
Office Visit    Patient Name: Francis Foster Date of Encounter: 11/18/2022  PCP:  Burnard Bunting, [facility]  Cardiologist:  Sherren Mocha, MD  Advanced Practice Provider:  No care team member to display Electrophysiologist:  None   HPI    Francis Foster is a 61 y.o. male past medical history significant for CAD status post DES to Dx in 2017, hypertension, hyperlipidemia, and morbid obesity presents today for annual follow-up visit.  He was last seen a year ago by Dr. Burt Knack and at that time did not had any specific cardiovascular related complaints.  He denies chest pain/pressure and shortness of breath.  He had not been doing as much walking as he had in the past.  He denied exertional symptoms.  He did bring in some home blood pressures that were above goal.  His weight was up but have been stable over the past 3 years.  He had gotten his weight under 300 pounds earlier that year.  He attributes his weight gain to lack of exercise and dietary indiscretion.  Today, he tells me he has felt well without any chest pains or shortness of breath.  He lives at [facility] and is retired.  He states he has gained a little bit of weight which he knows about.  He plans to increase his activity level and watch his diet a little more closely.  His blood pressure is elevated today at 156/70 but on retake it was 140/70.  Instead of increasing medications today we discussed lifestyle changes and he is committed to losing some weight which will help with all of his medical problems including his blood pressure.  Reports no shortness of breath nor dyspnea on exertion. Reports no chest pain, pressure, or tightness. No edema, orthopnea, PND. Reports no palpitations.    Past Medical History    Past Medical History:  Diagnosis Date   Abnormal nuclear stress test 12/28/2015   Benign essential HTN    CAD (coronary artery disease), native coronary artery 12/29/2015    S/p DES to ostial D1   Chest discomfort    Chest tightness    Depression    Dizziness    IGT (impaired glucose tolerance)    Lightheaded    Morbid obesity ([place])    Unstable angina [facility])    Past Surgical History:  Procedure Laterality Date   CARDIAC CATHETERIZATION N/A 12/28/2015   Procedure: Left Heart Cath and Coronary Angiography;  Surgeon: Sherren Mocha, MD;  Location: [facility];  Service: Cardiovascular;  Laterality: N/A;   HYPOSPADIAS CORRECTION     61 years old    Allergies  No Known Allergies   EKGs/Labs/Other Studies Reviewed:   The following studies were reviewed today:  Cardiac Cath 12/28/2015: Conclusion   1. Severe diagonal stenosis with successful PCI using a DES platform. 2. No significant stenosis in the RCA, LCx, or LAD proper   Recommend: ASA, Plavix x 12 months, aggressive risk reduction.   Diagnostic Dominance: Right Left Anterior Descending  Mild diffuse irregularity in the LAD is present. There is no significant stenosis throughout the LAD, but there is a critical lesion in the first diagonal branch.  First Environmental consultant.  Left Circumflex  The left circumflex is widely patent with no stenosis  Right Coronary Artery  The right coronary artery is a large, dominant vessel with no stenosis.  Intervention   Ost 1st Diag to 1st Diag lesion  PCI  The pre-interventional distal flow is normal (TIMI 3). Pre-stent angioplasty was performed. An unspecified stent was placed. Post-stent angioplasty was performed. Maximum pressure: 14 atm. The post-interventional distal flow is normal (TIMI 3). The intervention was successful. No complications occurred at this lesion. There is severe stenosis in the first diagonal branch. Heparin is used for anticoagulation. The patient was loaded with Plavix 600 mg. A cougar wire was advanced across the lesion without difficulty through an XB 3.5 cm guide catheter. The lesion was predilated with a 2.0 mm  balloon, stented with a 2.5 x 12 mm Promus DES, and postdilated with a 2.5 mm noncompliant balloon to 14 atm.  There is a 0% residual stenosis post intervention.    Coronary Diagrams   Diagnostic Dominance: Right Intervention     EKG:  EKG is  ordered today.  The ekg ordered today demonstrates sinus bradycardia, rate 51 bpm  Recent Labs: No results found for requested labs within last 365 days.  Recent Lipid Panel No results found for: "CHOL", "TRIG", "HDL", "CHOLHDL", "VLDL", "LDLCALC", "LDLDIRECT"  Home Medications   Current Meds  Medication Sig   aspirin 81 MG tablet Take 81 mg by mouth daily.   atorvastatin (LIPITOR) 80 MG tablet Take 1 tablet (80 mg total) by mouth daily at 6 PM.   indomethacin (INDOCIN) 50 MG capsule Take 50 mg by mouth 3 (three) times daily as needed.   indomethacin (INDOCIN) 50 MG capsule Take 50 mg by mouth daily.   indomethacin (INDOCIN) 50 MG suppository Place 50 mg rectally.   lisinopril-hydrochlorothiazide (ZESTORETIC) 20-12.5 MG tablet Take 2 tablets by mouth daily.   nebivolol (BYSTOLIC) 10 MG tablet Take 1 tablet (10 mg total) by mouth daily. Please schedule yearly appointment for future refills. Thank you   nitroGLYCERIN (NITROSTAT) 0.4 MG SL tablet Place 1 tablet (0.4 mg total) under the tongue every 5 (five) minutes as needed for chest pain.     Review of Systems      All other systems reviewed and are otherwise negative except as noted above.  Physical Exam    VS:  BP (!) 140/70   Pulse (!) 51   Ht 6' (1.829 m)   Wt (!) 340 lb 4.8 oz (154.4 kg)   SpO2 97%   BMI 46.15 kg/m  , BMI Body mass index is 46.15 kg/m.  Wt Readings from Last 3 Encounters:  11/18/22 (!) 340 lb 4.8 oz (154.4 kg)  09/30/21 (!) 323 lb 12.8 oz (146.9 kg)  08/03/20 (!) 324 lb (147 kg)     GEN: Well nourished, well developed, in no acute distress. HEENT: normal. Neck: Supple, no JVD, carotid bruits, or masses. Cardiac: RRR, no murmurs, rubs, or gallops. No  clubbing, cyanosis, 1+ bilateral edema.  Radials/PT 2+ and equal bilaterally.  Respiratory:  Respirations regular and unlabored, clear to auscultation bilaterally. GI: Soft, nontender, nondistended. MS: No deformity or atrophy. Skin: Warm and dry, no rash. Neuro:  Strength and sensation are intact. Psych: Normal affect.  Assessment & Plan    Coronary artery disease involving native coronary artery of native heart without angina pectoris -No chest pains -Continue GDMT: Aspirin 81 mg, Lipitor 80 mg, lisinopril/HCTZ 20-12.5 mg 2 tabs daily, nitroglycerin as needed, and Bystolic 10 mg daily  Essential hypertension -Slightly elevated today in the clinic -Continue current medication regimen and work on lifestyle modifications discussed today -Please keep a blood pressure log and take your blood pressure an hour after morning medicines.  Please record  these and bring to next appointment.  Mixed hyperlipidemia -Last LDL 47 -Continue Lipitor 80 mg daily  Morbid obesity -Discussed lifestyle modifications -Increase activity as tolerated and be mindful of diet  HYPERTENSION CONTROL Vitals:   11/18/22 1555 11/18/22 1729  BP: (!) 156/70 (!) 140/70    The patient's blood pressure is elevated above target today.  In order to address the patient's elevated BP: Blood pressure will be monitored at home to determine if medication changes need to be made.         Disposition: Follow up 6 months with Sherren Mocha, MD or APP.  Signed, Elgie Collard, PA-C 11/18/2022, 5:30 PM [place]

## 2022-12-21 ENCOUNTER — Other Ambulatory Visit: Payer: Self-pay | Admitting: Cardiovascular Disease

## 2022-12-22 ENCOUNTER — Other Ambulatory Visit: Payer: Self-pay | Admitting: Cardiovascular Disease

## 2022-12-22 DIAGNOSIS — E782 Mixed hyperlipidemia: Secondary | ICD-10-CM

## 2022-12-22 DIAGNOSIS — I2510 Atherosclerotic heart disease of native coronary artery without angina pectoris: Secondary | ICD-10-CM

## 2023-05-11 ENCOUNTER — Encounter: Payer: Self-pay | Admitting: Cardiovascular Disease

## 2023-05-11 ENCOUNTER — Ambulatory Visit: Payer: BC Managed Care – PPO | Attending: Cardiovascular Disease | Admitting: Cardiovascular Disease

## 2023-05-11 VITALS — BP 182/78 | HR 55 | Ht 72.0 in | Wt 335.0 lb

## 2023-05-11 DIAGNOSIS — I10 Essential (primary) hypertension: Secondary | ICD-10-CM | POA: Diagnosis not present

## 2023-05-11 DIAGNOSIS — E782 Mixed hyperlipidemia: Secondary | ICD-10-CM | POA: Diagnosis not present

## 2023-05-11 DIAGNOSIS — I2510 Atherosclerotic heart disease of native coronary artery without angina pectoris: Secondary | ICD-10-CM

## 2023-05-11 MED ORDER — AMLODIPINE BESYLATE 5 MG PO TABS: 5.0000 mg | ORAL_TABLET | Freq: Every day | ORAL | 3 refills | Status: DC

## 2023-05-11 NOTE — Progress Notes (Signed)
Cardiology Office Note:    Date:  05/11/2023   ID:  Francis Foster, DOB December 14, 1961, MRN [id number]  PCP:  Geoffry Paradise, MD   [facility] Providers Cardiologist:  Tonny Bollman, MD     Referring MD: Geoffry Paradise, MD   Chief Complaint  Patient presents with   Coronary Artery Disease    History of Present Illness:    Francis Foster is a 62 y.o. male presenting for follow-up evaluation. He is here alone today. His cardiovascular history is outlined below:  Coronary artery disease  S/p DES to Dx in 2017 Hypertension  Hyperlipidemia Morbid obesity  Today, he denies symptoms of palpitations, chest pain, shortness of breath, orthopnea, PND, lower extremity edema, dizziness, or syncope.  He continues to have a hard time with weight management.  He brings in home blood pressure readings and his blood pressure readings are consistently elevated with systolic readings in the 160s and 170s.   Past Medical History:  Diagnosis Date   Abnormal nuclear stress test 12/28/2015   Benign essential HTN    CAD (coronary artery disease), native coronary artery 12/29/2015   S/p DES to ostial D1   Chest discomfort    Chest tightness    Depression    Dizziness    IGT (impaired glucose tolerance)    Lightheaded    Morbid obesity ([facility])    Unstable angina [facility])     Past Surgical History:  Procedure Laterality Date   CARDIAC CATHETERIZATION N/A 12/28/2015   Procedure: Left Heart Cath and Coronary Angiography;  Surgeon: Tonny Bollman, MD;  Location: [facility];  Service: Cardiovascular;  Laterality: N/A;   HYPOSPADIAS CORRECTION     62 years old    Current Medications: Current Meds  Medication Sig   amLODipine (NORVASC) 5 MG tablet Take 1 tablet (5 mg total) by mouth daily.   aspirin 81 MG tablet Take 81 mg by mouth daily.   atorvastatin (LIPITOR) 80 MG tablet Take 1 tablet (80 mg total) by mouth daily at 6 PM.   indomethacin (INDOCIN) 50 MG capsule Take 50 mg by  mouth daily.   lisinopril-hydrochlorothiazide (ZESTORETIC) 20-12.5 MG tablet TAKE 2 TABLETS BY MOUTH EVERY DAY   nebivolol (BYSTOLIC) 10 MG tablet Take 1 tablet (10 mg total) by mouth daily.   nitroGLYCERIN (NITROSTAT) 0.4 MG SL tablet Place 1 tablet (0.4 mg total) under the tongue every 5 (five) minutes as needed for chest pain.   nystatin-triamcinolone (MYCOLOG II) cream Apply 1 Application topically at bedtime.     Allergies:   Patient has no known allergies.   Social History   Socioeconomic History   Marital status: Married    Spouse name: Not on file   Number of children: Not on file   Years of education: Not on file   Highest education level: Not on file  Occupational History   Not on file  Tobacco Use   Smoking status: Never   Smokeless tobacco: Never  Vaping Use   Vaping Use: Never used  Substance and Sexual Activity   Alcohol use: Yes    Alcohol/week: 0.0 standard drinks of alcohol   Drug use: No   Sexual activity: Yes  Other Topics Concern   Not on file  Social History Narrative   Works in [employer]	[place]. Single. 3 children. Grew up in [place], has lived in [place] since 1992   Social Determinants of Health   Financial Resource Strain: Not on file  Food Insecurity:  Not on file  Transportation Needs: Not on file  Physical Activity: Not on file  Stress: Not on file  Social Connections: Not on file     Family History: The patient's family history includes Healthy in his sister; Neuropathy in his father; Osteoarthritis in his mother.  ROS:   Please see the history of present illness.    All other systems reviewed and are negative.  EKGs/Labs/Other Studies Reviewed:    The following studies were reviewed today: Cardiac Studies & Procedures   CARDIAC CATHETERIZATION  CARDIAC CATHETERIZATION 12/28/2015  Narrative 1. Severe diagonal stenosis with successful PCI using a DES platform. 2. No significant stenosis in the RCA, LCx, or LAD  proper  Recommend: ASA, Plavix x 12 months, aggressive risk reduction.  Findings Coronary Findings Diagnostic  Dominance: Right  Left Anterior Descending Mild diffuse irregularity in the LAD is present. There is no significant stenosis throughout the LAD, but there is a critical lesion in the first diagonal branch.  First Personnel officer.  Left Circumflex The left circumflex is widely patent with no stenosis  Right Coronary Artery The right coronary artery is a large, dominant vessel with no stenosis.  Intervention  Ost 1st Diag to 1st Diag lesion PCI The pre-interventional distal flow is normal (TIMI 3). Pre-stent angioplasty was performed. An unspecified stent was placed. Post-stent angioplasty was performed. Maximum pressure: 14 atm. The post-interventional distal flow is normal (TIMI 3). The intervention was successful. No complications occurred at this lesion. There is severe stenosis in the first diagonal branch. Heparin is used for anticoagulation. The patient was loaded with Plavix 600 mg. A cougar wire was advanced across the lesion without difficulty through an XB 3.5 cm guide catheter. The lesion was predilated with a 2.0 mm balloon, stented with a 2.5 x 12 mm Promus DES, and postdilated with a 2.5 mm noncompliant balloon to 14 atm. There is a 0% residual stenosis post intervention.   STRESS TESTS  MYOCARDIAL PERFUSION IMAGING 12/27/2015  Narrative  Nuclear stress EF: 51%.  Blood pressure demonstrated a hypertensive response to exercise.  There was no ST segment deviation noted during stress.  This is an intermediate risk study.  Findings consistent with ischemia.  The left ventricular ejection fraction is mildly decreased (45-54%).  Intermediate risk stress nuclear study with a medium size, severe intensity, partially reversible distal anterolateral/apical defect consistent with apical thinning and moderate anterolateral/apical ischemia; EF 51 with  apical hypokinesis; mild LVE.               EKG:  EKG is not ordered today.    Recent Labs: No results found for requested labs within last 365 days.  Recent Lipid Panel No results found for: "CHOL", "TRIG", "HDL", "CHOLHDL", "VLDL", "LDLCALC", "LDLDIRECT"   Risk Assessment/Calculations:           Physical Exam:    VS:  BP (!) 182/78   Pulse (!) 55   Ht 6' (1.829 m)   Wt (!) 335 lb (152 kg)   SpO2 94%   BMI 45.43 kg/m     Wt Readings from Last 3 Encounters:  05/11/23 (!) 335 lb (152 kg)  11/18/22 (!) 340 lb 4.8 oz (154.4 kg)  09/30/21 (!) 323 lb 12.8 oz (146.9 kg)     GEN:  Well nourished, pleasant obese male well developed in no acute distress HEENT: Normal NECK: No JVD; No carotid bruits LYMPHATICS: No lymphadenopathy CARDIAC: RRR, soft ejection murmur at the right upper sternal border RESPIRATORY:  Clear to auscultation without rales, wheezing or rhonchi  ABDOMEN: Soft, non-tender, non-distended MUSCULOSKELETAL:  No edema; No deformity  SKIN: Warm and dry NEUROLOGIC:  Alert and oriented x 3 PSYCHIATRIC:  Normal affect   ASSESSMENT:    1. Coronary artery disease involving native coronary artery of native heart without angina pectoris   2. Mixed hyperlipidemia   3. Essential hypertension   4. Morbid obesity (HCC)    PLAN:    In order of problems listed above:  Stable with no angina.  Continue aspirin and high intensity statin drug. Lipids are excellent with a cholesterol of 127 and an LDL of 48.  He is treated with atorvastatin 80 mg daily. Blood pressure is suboptimally controlled on lisinopril, hydrochlorothiazide, and nebivolol.  Add amlodipine 5 mg daily.  Lengthy discussion on diet, exercise, and weight loss measures. Discussed weight loss today.  We discussed pharmacotherapy for weight loss and he will think about this, but he really does not want to take more medication.  He feels motivated to lose weight and would like to try to work through this  on his own.  I demonstrated a calorie counting app to him today.  We discussed specific nutritional recommendations.  He will continue to monitor his blood pressure at home.  He may need to increase amlodipine to 10 mg daily.  Hopefully weight loss will also help with blood pressure control.           Medication Adjustments/Labs and Tests Ordered: Current medicines are reviewed at length with the patient today.  Concerns regarding medicines are outlined above.  No orders of the defined types were placed in this encounter.  Meds ordered this encounter  Medications   amLODipine (NORVASC) 5 MG tablet    Sig: Take 1 tablet (5 mg total) by mouth daily.    Dispense:  90 tablet    Refill:  3    Patient Instructions  Medication Instructions:  Your physician has recommended you make the following change in your medication:   1) START amlodipine 5mg  daily  *If you need a refill on your cardiac medications before your next appointment, please call your pharmacy*  Lab Work: None ordered today.  Testing/Procedures: None ordered today.  Follow-Up: At [facility], you and your health needs are our priority.  As part of our continuing mission to provide you with exceptional heart care, we have created designated Provider Care Teams.  These Care Teams include your primary Cardiologist (physician) and Advanced Practice Providers (APPs -  Physician Assistants and Nurse Practitioners) who all work together to provide you with the care you need, when you need it.  Your next appointment:   1 year(s)  The format for your next appointment:   In Person  Provider:   Tonny Bollman, MD {   Signed, Tonny Bollman, MD  05/11/2023 5:06 PM    [facility]

## 2023-05-11 NOTE — Patient Instructions (Signed)
Medication Instructions:  Your physician has recommended you make the following change in your medication:   1) START amlodipine 5mg  daily  *If you need a refill on your cardiac medications before your next appointment, please call your pharmacy*  Lab Work: None ordered today.  Testing/Procedures: None ordered today.  Follow-Up: At [facility], you and your health needs are our priority.  As part of our continuing mission to provide you with exceptional heart care, we have created designated Provider Care Teams.  These Care Teams include your primary Cardiologist (physician) and Advanced Practice Providers (APPs -  Physician Assistants and Nurse Practitioners) who all work together to provide you with the care you need, when you need it.  Your next appointment:   1 year(s)  The format for your next appointment:   In Person  Provider:   Tonny Bollman, MD {

## 2023-05-12 DIAGNOSIS — D2272 Melanocytic nevi of left lower limb, including hip: Secondary | ICD-10-CM | POA: Diagnosis not present

## 2023-05-12 DIAGNOSIS — D2271 Melanocytic nevi of right lower limb, including hip: Secondary | ICD-10-CM | POA: Diagnosis not present

## 2023-05-12 DIAGNOSIS — L814 Other melanin hyperpigmentation: Secondary | ICD-10-CM | POA: Diagnosis not present

## 2023-05-12 DIAGNOSIS — D485 Neoplasm of uncertain behavior of skin: Secondary | ICD-10-CM | POA: Diagnosis not present

## 2023-05-12 DIAGNOSIS — D2261 Melanocytic nevi of right upper limb, including shoulder: Secondary | ICD-10-CM | POA: Diagnosis not present

## 2023-05-12 DIAGNOSIS — D225 Melanocytic nevi of trunk: Secondary | ICD-10-CM | POA: Diagnosis not present

## 2023-11-23 DIAGNOSIS — Z1339 Encounter for screening examination for other mental health and behavioral disorders: Secondary | ICD-10-CM | POA: Diagnosis not present

## 2023-11-23 DIAGNOSIS — Z0000 Encounter for general adult medical examination without abnormal findings: Secondary | ICD-10-CM | POA: Diagnosis not present

## 2023-11-23 DIAGNOSIS — Z23 Encounter for immunization: Secondary | ICD-10-CM | POA: Diagnosis not present

## 2023-11-23 DIAGNOSIS — R82998 Other abnormal findings in urine: Secondary | ICD-10-CM | POA: Diagnosis not present

## 2023-11-23 DIAGNOSIS — R7302 Impaired glucose tolerance (oral): Secondary | ICD-10-CM | POA: Diagnosis not present

## 2023-11-23 DIAGNOSIS — Z125 Encounter for screening for malignant neoplasm of prostate: Secondary | ICD-10-CM | POA: Diagnosis not present

## 2023-11-23 DIAGNOSIS — Z1331 Encounter for screening for depression: Secondary | ICD-10-CM | POA: Diagnosis not present

## 2023-11-23 DIAGNOSIS — E785 Hyperlipidemia, unspecified: Secondary | ICD-10-CM | POA: Diagnosis not present

## 2023-11-23 DIAGNOSIS — I10 Essential (primary) hypertension: Secondary | ICD-10-CM | POA: Diagnosis not present

## 2023-11-24 DIAGNOSIS — H5213 Myopia, bilateral: Secondary | ICD-10-CM | POA: Diagnosis not present

## 2023-11-24 DIAGNOSIS — H43813 Vitreous degeneration, bilateral: Secondary | ICD-10-CM | POA: Diagnosis not present

## 2023-11-24 DIAGNOSIS — H2513 Age-related nuclear cataract, bilateral: Secondary | ICD-10-CM | POA: Diagnosis not present

## 2023-12-08 DIAGNOSIS — D225 Melanocytic nevi of trunk: Secondary | ICD-10-CM | POA: Diagnosis not present

## 2023-12-08 DIAGNOSIS — D2261 Melanocytic nevi of right upper limb, including shoulder: Secondary | ICD-10-CM | POA: Diagnosis not present

## 2023-12-08 DIAGNOSIS — L814 Other melanin hyperpigmentation: Secondary | ICD-10-CM | POA: Diagnosis not present

## 2023-12-08 DIAGNOSIS — D2262 Melanocytic nevi of left upper limb, including shoulder: Secondary | ICD-10-CM | POA: Diagnosis not present

## 2023-12-20 ENCOUNTER — Other Ambulatory Visit: Payer: Self-pay | Admitting: Cardiovascular Disease

## 2023-12-20 DIAGNOSIS — I2510 Atherosclerotic heart disease of native coronary artery without angina pectoris: Secondary | ICD-10-CM

## 2023-12-20 DIAGNOSIS — E782 Mixed hyperlipidemia: Secondary | ICD-10-CM

## 2024-02-03 DIAGNOSIS — Z7189 Other specified counseling: Secondary | ICD-10-CM | POA: Diagnosis not present

## 2024-02-03 DIAGNOSIS — D485 Neoplasm of uncertain behavior of skin: Secondary | ICD-10-CM | POA: Diagnosis not present

## 2024-02-03 DIAGNOSIS — D2271 Melanocytic nevi of right lower limb, including hip: Secondary | ICD-10-CM | POA: Diagnosis not present

## 2024-03-19 ENCOUNTER — Other Ambulatory Visit: Payer: Self-pay | Admitting: Cardiovascular Disease

## 2024-06-01 DIAGNOSIS — D2272 Melanocytic nevi of left lower limb, including hip: Secondary | ICD-10-CM | POA: Diagnosis not present

## 2024-06-01 DIAGNOSIS — L814 Other melanin hyperpigmentation: Secondary | ICD-10-CM | POA: Diagnosis not present

## 2024-06-01 DIAGNOSIS — D2262 Melanocytic nevi of left upper limb, including shoulder: Secondary | ICD-10-CM | POA: Diagnosis not present

## 2024-06-01 DIAGNOSIS — D485 Neoplasm of uncertain behavior of skin: Secondary | ICD-10-CM | POA: Diagnosis not present

## 2024-06-01 DIAGNOSIS — D225 Melanocytic nevi of trunk: Secondary | ICD-10-CM | POA: Diagnosis not present

## 2024-06-01 DIAGNOSIS — D235 Other benign neoplasm of skin of trunk: Secondary | ICD-10-CM | POA: Diagnosis not present

## 2024-06-15 ENCOUNTER — Other Ambulatory Visit: Payer: Self-pay | Admitting: Cardiovascular Disease

## 2024-06-15 DIAGNOSIS — E782 Mixed hyperlipidemia: Secondary | ICD-10-CM

## 2024-06-15 DIAGNOSIS — I2510 Atherosclerotic heart disease of native coronary artery without angina pectoris: Secondary | ICD-10-CM

## 2024-06-17 ENCOUNTER — Telehealth: Payer: Self-pay | Admitting: Cardiovascular Disease

## 2024-06-17 DIAGNOSIS — E782 Mixed hyperlipidemia: Secondary | ICD-10-CM

## 2024-06-17 DIAGNOSIS — I2510 Atherosclerotic heart disease of native coronary artery without angina pectoris: Secondary | ICD-10-CM

## 2024-06-17 MED ORDER — AMLODIPINE BESYLATE 5 MG PO TABS: 5.0000 mg | ORAL_TABLET | Freq: Every day | ORAL | 0 refills | Status: DC

## 2024-06-17 MED ORDER — LISINOPRIL-HYDROCHLOROTHIAZIDE 20-12.5 MG PO TABS: 2.0000 | ORAL_TABLET | Freq: Every day | ORAL | 0 refills | Status: DC

## 2024-06-17 MED ORDER — NEBIVOLOL HCL 10 MG PO TABS: 10.0000 mg | ORAL_TABLET | Freq: Every day | ORAL | 0 refills | Status: DC

## 2024-06-17 NOTE — Telephone Encounter (Signed)
 Pt's medications were sent to pt's pharmacy as requested. Confirmation received.

## 2024-06-17 NOTE — Telephone Encounter (Signed)
*  STAT* If patient is at the pharmacy, call can be transferred to refill team.   1. Which medications need to be refilled? (please list name of each medication and dose if known)   lisinopril -hydrochlorothiazide  (ZESTORETIC ) 20-12.5 MG tablet   amLODipine  (NORVASC ) 5 MG tablet   nebivolol  (BYSTOLIC ) 10 MG tablet    2. Which pharmacy/location (including street and city if local pharmacy) is medication to be sent to? [id number] - [place], [place] - [address] AT [address] [facility]   3. Do they need a 30 day or 90 day supply?  90 day supply

## 2024-07-05 DIAGNOSIS — D229 Melanocytic nevi, unspecified: Secondary | ICD-10-CM | POA: Diagnosis not present

## 2024-07-22 NOTE — Progress Notes (Signed)
 Office Visit    Patient Name: Francis Foster Date of Encounter: 07/25/2024  PCP:  Shepard Ade, MD   [facility]  Cardiologist:  Ozell Fell, MD  Advanced Practice Provider:  No care team member to display Electrophysiologist:  None   HPI    Francis Foster is a 63 y.o. male past medical history significant for CAD status post DES to Dx in 2017, hypertension, hyperlipidemia, and morbid obesity presents today for annual follow-up visit.  He was last seen a year ago by Dr. Fell and at that time did not had any specific cardiovascular related complaints.  He denies chest pain/pressure and shortness of breath.  He had not been doing as much walking as he had in the past.  He denied exertional symptoms.  He did bring in some home blood pressures that were above goal.  His weight was up but have been stable over the past 3 years.  He had gotten his weight under 300 pounds earlier that year.  He attributes his weight gain to lack of exercise and dietary indiscretion.  I saw him 11/18/2022, he tells me he has felt well without any chest pains or shortness of breath.  He lives at [facility] and is retired.  He states he has gained a little bit of weight which he knows about.  He plans to increase his activity level and watch his diet a little more closely.  His blood pressure is elevated today at 156/70 but on retake it was 140/70.  Instead of increasing medications today we discussed lifestyle changes and he is committed to losing some weight which will help with all of his medical problems including his blood pressure.  Reports no shortness of breath nor dyspnea on exertion. Reports no chest pain, pressure, or tightness. No edema, orthopnea, PND. Reports no palpitations.   He was seen in May 2024 by Dr. [place].  At that time he was doing well without any palpitations, chest pain, shortness of breath, orthopnea, PND, lower extremity edema, dizziness, or  syncope.  He continued to have a hard time with weight management.  Brings in some home blood pressure readings which were consistently elevated with systolic readings in the 160s to 170s.  Amlodipine  5 mg daily was added.  Today, he presents with hypertension for a cardiovascular follow-up.  His blood pressure remains slightly elevated, and he has not been monitoring it at home. He has gained weight over the past year. His lipid profile shows elevated triglycerides at 233 mg/dL, low LDL cholesterol at 43 mg/dL, HDL at 34 mg/dL, and total cholesterol at 124 mg/dL. He is not taking fish oil or other medications for triglycerides. His diet includes fish, shrimp, salads, and occasional steak.  He experiences no chest pain, shortness of breath, palpitations, or racing heartbeat. During recent travel to a higher elevation, he felt fine without cardiovascular symptoms.  He plans to increase physical activity, particularly walking, as the weather cools. [place] [address] [place], which he finds suitable for walking.  Reports no shortness of breath nor dyspnea on exertion. Reports no chest pain, pressure, or tightness. No edema, orthopnea, PND. Reports no palpitations.   Discussed the use of AI scribe software for clinical note transcription with the patient, who gave verbal consent to proceed.  Past Medical History    Past Medical History:  Diagnosis Date   Abnormal nuclear stress test 12/28/2015   Benign essential HTN  CAD (coronary artery disease), native coronary artery 12/29/2015   S/p DES to ostial D1   Chest discomfort    Chest tightness    Depression    Dizziness    IGT (impaired glucose tolerance)    Lightheaded    Morbid obesity (HCC)    Unstable angina [facility])    Past Surgical History:  Procedure Laterality Date   CARDIAC CATHETERIZATION N/A 12/28/2015   Procedure: Left Heart Cath and Coronary Angiography;  Surgeon: Ozell Fell, MD;  Location: [facility]  INVASIVE CV LAB;  Service: Cardiovascular;  Laterality: N/A;   HYPOSPADIAS CORRECTION     63 years old    Allergies  No Known Allergies   EKGs/Labs/Other Studies Reviewed:   The following studies were reviewed today:  Cardiac Cath 12/28/2015: Conclusion   1. Severe diagonal stenosis with successful PCI using a DES platform. 2. No significant stenosis in the RCA, LCx, or LAD proper   Recommend: ASA, Plavix  x 12 months, aggressive risk reduction.   Diagnostic Dominance: Right Left Anterior Descending  Mild diffuse irregularity in the LAD is present. There is no significant stenosis throughout the LAD, but there is a critical lesion in the first diagonal branch.  First Freight forwarder.  Left Circumflex  The left circumflex is widely patent with no stenosis  Right Coronary Artery  The right coronary artery is a large, dominant vessel with no stenosis.  Intervention   Ost 1st Diag to 1st Diag lesion  PCI  The pre-interventional distal flow is normal (TIMI 3). Pre-stent angioplasty was performed. An unspecified stent was placed. Post-stent angioplasty was performed. Maximum pressure: 14 atm. The post-interventional distal flow is normal (TIMI 3). The intervention was successful. No complications occurred at this lesion. There is severe stenosis in the first diagonal branch. Heparin  is used for anticoagulation. The patient was loaded with Plavix  600 mg. A cougar wire was advanced across the lesion without difficulty through an XB 3.5 cm guide catheter. The lesion was predilated with a 2.0 mm balloon, stented with a 2.5 x 12 mm Promus DES, and postdilated with a 2.5 mm noncompliant balloon to 14 atm.  There is a 0% residual stenosis post intervention.    Coronary Diagrams   Diagnostic Dominance: Right Intervention     EKG:  EKG is  ordered today.  The ekg ordered today demonstrates sinus bradycardia, rate 51 bpm  Recent Labs: No results found for requested labs within  last 365 days.  Recent Lipid Panel No results found for: CHOL, TRIG, HDL, CHOLHDL, VLDL, LDLCALC, LDLDIRECT  Home Medications   Current Meds  Medication Sig   aspirin  81 MG tablet Take 81 mg by mouth daily.   indomethacin (INDOCIN) 50 MG capsule Take 50 mg by mouth daily.   nystatin-triamcinolone (MYCOLOG II) cream Apply 1 Application topically at bedtime.   [DISCONTINUED] amLODipine  (NORVASC ) 5 MG tablet Take 1 tablet (5 mg total) by mouth daily.   [DISCONTINUED] atorvastatin  (LIPITOR ) 80 MG tablet Take 1 tablet (80 mg total) by mouth daily at 6 PM.   [DISCONTINUED] lisinopril -hydrochlorothiazide  (ZESTORETIC ) 20-12.5 MG tablet Take 2 tablets by mouth daily.   [DISCONTINUED] nebivolol  (BYSTOLIC ) 10 MG tablet Take 1 tablet (10 mg total) by mouth daily.   [DISCONTINUED] nitroGLYCERIN  (NITROSTAT ) 0.4 MG SL tablet Place 1 tablet (0.4 mg total) under the tongue every 5 (five) minutes as needed for chest pain.     Review of Systems      All other systems reviewed and are otherwise negative  except as noted above.  Physical Exam    VS:  BP (!) 156/80   Pulse 67   Ht 6' (1.829 m)   Wt (!) 346 lb 3.2 oz (157 kg)   SpO2 95%   BMI 46.95 kg/m  , BMI Body mass index is 46.95 kg/m.  Wt Readings from Last 3 Encounters:  07/25/24 (!) 346 lb 3.2 oz (157 kg)  05/11/23 (!) 335 lb (152 kg)  11/18/22 (!) 340 lb 4.8 oz (154.4 kg)     GEN: Well nourished, well developed, in no acute distress. HEENT: normal. Neck: Supple, no JVD, carotid bruits, or masses. Cardiac: RRR, no murmurs, rubs, or gallops. No clubbing, cyanosis, 1+ bilateral edema.  Radials/PT 2+ and equal bilaterally.  Respiratory:  Respirations regular and unlabored, clear to auscultation bilaterally. GI: Soft, nontender, nondistended. MS: No deformity or atrophy. Skin: Warm and dry, no rash. Neuro:  Strength and sensation are intact. Psych: Normal affect.  Assessment & Plan    Essential hypertension Blood  pressure remains elevated at 150s systolic despite maxed out doses of lisinopril  HCTZ and Bystolic . Potential to increase amlodipine , but risk of peripheral edema at higher doses. - Encouraged home blood pressure monitoring and reporting. - Aimed for systolic blood pressure less than 135 mmHg. - Considered increasing physical activity, such as walking, to help lower blood pressure.  Hypertriglyceridemia Triglycerides elevated at 233 mg/dL, above target of 849 mg/dL. LDL well-controlled at 43 mg/dL, HDL at 34 mg/dL. He declined fish oil supplementation. - Encouraged dietary modifications to reduce triglycerides, focusing on reducing sugars, white starches, and alcohol .  Overweight Weight gain of approximately 10 pounds since last year. He acknowledges need for increased physical activity. - Encouraged increased physical activity, such as walking. - Discussed weight management strategies and potential dietary improvements.    Disposition: Follow up 6 months with Ozell Fell, MD or APP.  Signed, Orren LOISE Fabry, PA-C 07/25/2024, 4:27 PM [facility]

## 2024-07-25 ENCOUNTER — Ambulatory Visit: Attending: Physician Assistant | Admitting: Physician Assistant

## 2024-07-25 VITALS — BP 156/80 | HR 67 | Ht 72.0 in | Wt 346.2 lb

## 2024-07-25 DIAGNOSIS — E782 Mixed hyperlipidemia: Secondary | ICD-10-CM

## 2024-07-25 DIAGNOSIS — I10 Essential (primary) hypertension: Secondary | ICD-10-CM | POA: Diagnosis not present

## 2024-07-25 DIAGNOSIS — I2510 Atherosclerotic heart disease of native coronary artery without angina pectoris: Secondary | ICD-10-CM

## 2024-07-25 DIAGNOSIS — Z79899 Other long term (current) drug therapy: Secondary | ICD-10-CM

## 2024-07-25 MED ORDER — LISINOPRIL-HYDROCHLOROTHIAZIDE 20-12.5 MG PO TABS: 2.0000 | ORAL_TABLET | Freq: Every day | ORAL | 3 refills | Status: AC

## 2024-07-25 MED ORDER — AMLODIPINE BESYLATE 5 MG PO TABS: 5.0000 mg | ORAL_TABLET | Freq: Every day | ORAL | 3 refills | Status: AC

## 2024-07-25 MED ORDER — NEBIVOLOL HCL 10 MG PO TABS: 10.0000 mg | ORAL_TABLET | Freq: Every day | ORAL | 3 refills | Status: AC

## 2024-07-25 MED ORDER — NITROGLYCERIN 0.4 MG SL SUBL: 0.4000 mg | SUBLINGUAL_TABLET | SUBLINGUAL | 3 refills | Status: AC | PRN

## 2024-07-25 MED ORDER — ATORVASTATIN CALCIUM 80 MG PO TABS: 80.0000 mg | ORAL_TABLET | Freq: Every day | ORAL | 3 refills | Status: AC

## 2024-07-25 NOTE — Patient Instructions (Addendum)
   Follow-Up: At [facility], you and your health needs are our priority.  As part of our continuing mission to provide you with exceptional heart care, our providers are all part of one team.  This team includes your primary Cardiologist (physician) and Advanced Practice Providers or APPs (Physician Assistants and Nurse Practitioners) who all work together to provide you with the care you need, when you need it.  Your next appointment:   1 year(s)  Provider:   Ozell Fell, MD    We recommend signing up for the patient portal called MyChart.  Sign up information is provided on this After Visit Summary.  MyChart is used to connect with patients for Virtual Visits (Telemedicine).  Patients are able to view lab/test results, encounter notes, upcoming appointments, etc.  Non-urgent messages can be sent to your provider as well.   To learn more about what you can do with MyChart, go to [email or web address].   Other Instructions Monitor Blood Pressure.

## 2024-10-14 ENCOUNTER — Other Ambulatory Visit: Payer: Self-pay | Admitting: Physician Assistant

## 2024-10-14 DIAGNOSIS — I10 Essential (primary) hypertension: Secondary | ICD-10-CM

## 2024-11-23 DIAGNOSIS — L2989 Other pruritus: Secondary | ICD-10-CM | POA: Diagnosis not present

## 2024-11-23 DIAGNOSIS — L538 Other specified erythematous conditions: Secondary | ICD-10-CM | POA: Diagnosis not present

## 2024-11-23 DIAGNOSIS — Z85828 Personal history of other malignant neoplasm of skin: Secondary | ICD-10-CM | POA: Diagnosis not present

## 2024-11-23 DIAGNOSIS — D1801 Hemangioma of skin and subcutaneous tissue: Secondary | ICD-10-CM | POA: Diagnosis not present

## 2024-11-23 DIAGNOSIS — L820 Inflamed seborrheic keratosis: Secondary | ICD-10-CM | POA: Diagnosis not present

## 2024-11-23 DIAGNOSIS — D485 Neoplasm of uncertain behavior of skin: Secondary | ICD-10-CM | POA: Diagnosis not present

## 2024-11-23 DIAGNOSIS — L814 Other melanin hyperpigmentation: Secondary | ICD-10-CM | POA: Diagnosis not present

## 2024-11-23 DIAGNOSIS — L821 Other seborrheic keratosis: Secondary | ICD-10-CM | POA: Diagnosis not present

## 2024-11-28 DIAGNOSIS — R82998 Other abnormal findings in urine: Secondary | ICD-10-CM | POA: Diagnosis not present

## 2024-11-28 DIAGNOSIS — Z1331 Encounter for screening for depression: Secondary | ICD-10-CM | POA: Diagnosis not present

## 2024-11-28 DIAGNOSIS — Z0000 Encounter for general adult medical examination without abnormal findings: Secondary | ICD-10-CM | POA: Diagnosis not present

## 2024-11-28 DIAGNOSIS — I10 Essential (primary) hypertension: Secondary | ICD-10-CM | POA: Diagnosis not present
